# Patient Record
Sex: Female | Born: 1960 | Race: White | Hispanic: No | Marital: Married | Smoking: Never smoker
Health system: Southern US, Community
[De-identification: ages and names within clinical notes are randomized; demographics above are authoritative.]

## PROBLEM LIST (undated history)

## (undated) DIAGNOSIS — T7840XA Allergy, unspecified, initial encounter: Secondary | ICD-10-CM

## (undated) DIAGNOSIS — C801 Malignant (primary) neoplasm, unspecified: Secondary | ICD-10-CM

## (undated) DIAGNOSIS — I509 Heart failure, unspecified: Secondary | ICD-10-CM

## (undated) DIAGNOSIS — E785 Hyperlipidemia, unspecified: Secondary | ICD-10-CM

## (undated) DIAGNOSIS — D219 Benign neoplasm of connective and other soft tissue, unspecified: Secondary | ICD-10-CM

## (undated) DIAGNOSIS — I517 Cardiomegaly: Secondary | ICD-10-CM

## (undated) DIAGNOSIS — I10 Essential (primary) hypertension: Secondary | ICD-10-CM

## (undated) DIAGNOSIS — G4730 Sleep apnea, unspecified: Secondary | ICD-10-CM

## (undated) DIAGNOSIS — M8580 Other specified disorders of bone density and structure, unspecified site: Secondary | ICD-10-CM

## (undated) HISTORY — PX: TUBAL LIGATION: SHX77

## (undated) HISTORY — PX: POLYPECTOMY: SHX149

## (undated) HISTORY — DX: Benign neoplasm of connective and other soft tissue, unspecified: D21.9

## (undated) HISTORY — DX: Hyperlipidemia, unspecified: E78.5

## (undated) HISTORY — DX: Malignant (primary) neoplasm, unspecified: C80.1

## (undated) HISTORY — DX: Heart failure, unspecified: I50.9

## (undated) HISTORY — PX: WISDOM TOOTH EXTRACTION: SHX21

## (undated) HISTORY — PX: OTHER SURGICAL HISTORY: SHX169

## (undated) HISTORY — DX: Allergy, unspecified, initial encounter: T78.40XA

## (undated) HISTORY — DX: Other specified disorders of bone density and structure, unspecified site: M85.80

## (undated) HISTORY — PX: HEMORRHOIDECTOMY WITH HEMORRHOID BANDING: SHX5633

## (undated) HISTORY — DX: Essential (primary) hypertension: I10

## (undated) HISTORY — DX: Sleep apnea, unspecified: G47.30

## (undated) HISTORY — DX: Cardiomegaly: I51.7

---

## 1989-07-13 DIAGNOSIS — I517 Cardiomegaly: Secondary | ICD-10-CM

## 1989-07-13 HISTORY — DX: Cardiomegaly: I51.7

## 1990-07-13 DIAGNOSIS — I509 Heart failure, unspecified: Secondary | ICD-10-CM

## 1990-07-13 HISTORY — DX: Heart failure, unspecified: I50.9

## 1998-07-03 ENCOUNTER — Other Ambulatory Visit: Admission: RE | Admit: 1998-07-03 | Discharge: 1998-07-03 | Payer: Self-pay | Admitting: Obstetrics and Gynecology

## 1999-12-11 ENCOUNTER — Ambulatory Visit (HOSPITAL_COMMUNITY): Admission: RE | Admit: 1999-12-11 | Discharge: 1999-12-11 | Payer: Self-pay | Admitting: Family Medicine

## 1999-12-11 ENCOUNTER — Encounter: Payer: Self-pay | Admitting: Family Medicine

## 2000-03-25 ENCOUNTER — Other Ambulatory Visit: Admission: RE | Admit: 2000-03-25 | Discharge: 2000-03-25 | Payer: Self-pay | Admitting: Obstetrics and Gynecology

## 2001-06-28 ENCOUNTER — Other Ambulatory Visit: Admission: RE | Admit: 2001-06-28 | Discharge: 2001-06-28 | Payer: Self-pay | Admitting: Obstetrics and Gynecology

## 2002-07-24 ENCOUNTER — Other Ambulatory Visit: Admission: RE | Admit: 2002-07-24 | Discharge: 2002-07-24 | Payer: Self-pay | Admitting: Obstetrics and Gynecology

## 2003-08-10 ENCOUNTER — Other Ambulatory Visit: Admission: RE | Admit: 2003-08-10 | Discharge: 2003-08-10 | Payer: Self-pay | Admitting: Obstetrics and Gynecology

## 2004-08-25 ENCOUNTER — Other Ambulatory Visit: Admission: RE | Admit: 2004-08-25 | Discharge: 2004-08-25 | Payer: Self-pay | Admitting: Obstetrics and Gynecology

## 2004-11-12 ENCOUNTER — Ambulatory Visit (HOSPITAL_COMMUNITY): Admission: RE | Admit: 2004-11-12 | Discharge: 2004-11-12 | Payer: Self-pay | Admitting: Family Medicine

## 2006-05-13 HISTORY — PX: COLONOSCOPY: SHX174

## 2006-05-18 ENCOUNTER — Ambulatory Visit: Payer: Self-pay | Admitting: Gastroenterology

## 2006-06-07 ENCOUNTER — Ambulatory Visit: Payer: Self-pay | Admitting: Gastroenterology

## 2006-07-15 ENCOUNTER — Ambulatory Visit: Payer: Self-pay | Admitting: Gastroenterology

## 2006-09-22 ENCOUNTER — Ambulatory Visit (HOSPITAL_COMMUNITY): Admission: RE | Admit: 2006-09-22 | Discharge: 2006-09-22 | Payer: Self-pay | Admitting: Surgery

## 2011-12-10 ENCOUNTER — Other Ambulatory Visit: Payer: Self-pay | Admitting: Physician Assistant

## 2011-12-10 ENCOUNTER — Ambulatory Visit
Admission: RE | Admit: 2011-12-10 | Discharge: 2011-12-10 | Disposition: A | Discharge status: Home / Self Care | Payer: BC Managed Care – PPO | Source: Ambulatory Visit | Attending: Physician Assistant | Admitting: Physician Assistant

## 2011-12-10 DIAGNOSIS — R05 Cough: Secondary | ICD-10-CM

## 2011-12-10 DIAGNOSIS — R059 Cough, unspecified: Secondary | ICD-10-CM

## 2012-01-04 ENCOUNTER — Ambulatory Visit: Payer: Self-pay | Admitting: Cardiovascular Disease

## 2013-01-23 ENCOUNTER — Ambulatory Visit: Payer: Worker's Compensation | Attending: Orthopedic Surgery | Admitting: Physical Therapy

## 2013-01-23 DIAGNOSIS — M25519 Pain in unspecified shoulder: Secondary | ICD-10-CM | POA: Insufficient documentation

## 2013-01-23 DIAGNOSIS — M25619 Stiffness of unspecified shoulder, not elsewhere classified: Secondary | ICD-10-CM | POA: Insufficient documentation

## 2013-01-23 DIAGNOSIS — IMO0001 Reserved for inherently not codable concepts without codable children: Secondary | ICD-10-CM | POA: Insufficient documentation

## 2013-01-23 DIAGNOSIS — M7989 Other specified soft tissue disorders: Secondary | ICD-10-CM | POA: Insufficient documentation

## 2013-01-25 ENCOUNTER — Ambulatory Visit: Payer: Worker's Compensation | Attending: Orthopedic Surgery | Admitting: Physical Therapy

## 2013-01-25 DIAGNOSIS — IMO0001 Reserved for inherently not codable concepts without codable children: Secondary | ICD-10-CM | POA: Insufficient documentation

## 2013-01-25 DIAGNOSIS — M7989 Other specified soft tissue disorders: Secondary | ICD-10-CM | POA: Insufficient documentation

## 2013-01-25 DIAGNOSIS — M25519 Pain in unspecified shoulder: Secondary | ICD-10-CM | POA: Insufficient documentation

## 2013-01-25 DIAGNOSIS — M25619 Stiffness of unspecified shoulder, not elsewhere classified: Secondary | ICD-10-CM | POA: Insufficient documentation

## 2013-01-27 ENCOUNTER — Ambulatory Visit: Payer: Worker's Compensation | Attending: Orthopedic Surgery | Admitting: Physical Therapy

## 2013-01-27 DIAGNOSIS — M25519 Pain in unspecified shoulder: Secondary | ICD-10-CM | POA: Insufficient documentation

## 2013-01-27 DIAGNOSIS — M7989 Other specified soft tissue disorders: Secondary | ICD-10-CM | POA: Insufficient documentation

## 2013-01-27 DIAGNOSIS — IMO0001 Reserved for inherently not codable concepts without codable children: Secondary | ICD-10-CM | POA: Insufficient documentation

## 2013-01-27 DIAGNOSIS — M25619 Stiffness of unspecified shoulder, not elsewhere classified: Secondary | ICD-10-CM | POA: Insufficient documentation

## 2013-01-31 ENCOUNTER — Ambulatory Visit: Payer: Worker's Compensation | Admitting: Physical Therapy

## 2013-02-03 ENCOUNTER — Ambulatory Visit: Payer: Worker's Compensation | Attending: Orthopedic Surgery | Admitting: Physical Therapy

## 2013-02-03 DIAGNOSIS — M25519 Pain in unspecified shoulder: Secondary | ICD-10-CM | POA: Insufficient documentation

## 2013-02-03 DIAGNOSIS — M25619 Stiffness of unspecified shoulder, not elsewhere classified: Secondary | ICD-10-CM | POA: Insufficient documentation

## 2013-02-03 DIAGNOSIS — IMO0001 Reserved for inherently not codable concepts without codable children: Secondary | ICD-10-CM | POA: Insufficient documentation

## 2013-02-03 DIAGNOSIS — M7989 Other specified soft tissue disorders: Secondary | ICD-10-CM | POA: Insufficient documentation

## 2013-02-06 ENCOUNTER — Ambulatory Visit: Payer: Worker's Compensation | Attending: Orthopedic Surgery | Admitting: Physical Therapy

## 2013-02-06 DIAGNOSIS — M25619 Stiffness of unspecified shoulder, not elsewhere classified: Secondary | ICD-10-CM | POA: Insufficient documentation

## 2013-02-06 DIAGNOSIS — M25519 Pain in unspecified shoulder: Secondary | ICD-10-CM | POA: Insufficient documentation

## 2013-02-06 DIAGNOSIS — M7989 Other specified soft tissue disorders: Secondary | ICD-10-CM | POA: Insufficient documentation

## 2013-02-06 DIAGNOSIS — IMO0001 Reserved for inherently not codable concepts without codable children: Secondary | ICD-10-CM | POA: Insufficient documentation

## 2013-02-09 ENCOUNTER — Ambulatory Visit: Payer: Worker's Compensation | Attending: Orthopedic Surgery | Admitting: Physical Therapy

## 2013-02-09 DIAGNOSIS — IMO0001 Reserved for inherently not codable concepts without codable children: Secondary | ICD-10-CM | POA: Insufficient documentation

## 2013-02-09 DIAGNOSIS — M25519 Pain in unspecified shoulder: Secondary | ICD-10-CM | POA: Insufficient documentation

## 2013-02-09 DIAGNOSIS — M7989 Other specified soft tissue disorders: Secondary | ICD-10-CM | POA: Insufficient documentation

## 2013-02-09 DIAGNOSIS — M25619 Stiffness of unspecified shoulder, not elsewhere classified: Secondary | ICD-10-CM | POA: Insufficient documentation

## 2013-02-20 ENCOUNTER — Ambulatory Visit: Payer: Worker's Compensation | Attending: Orthopedic Surgery | Admitting: Physical Therapy

## 2013-02-20 DIAGNOSIS — M25519 Pain in unspecified shoulder: Secondary | ICD-10-CM | POA: Insufficient documentation

## 2013-02-20 DIAGNOSIS — IMO0001 Reserved for inherently not codable concepts without codable children: Secondary | ICD-10-CM | POA: Insufficient documentation

## 2013-02-20 DIAGNOSIS — M25619 Stiffness of unspecified shoulder, not elsewhere classified: Secondary | ICD-10-CM | POA: Insufficient documentation

## 2013-02-23 ENCOUNTER — Ambulatory Visit: Payer: Worker's Compensation | Attending: Orthopedic Surgery | Admitting: Physical Therapy

## 2013-02-23 DIAGNOSIS — M25619 Stiffness of unspecified shoulder, not elsewhere classified: Secondary | ICD-10-CM | POA: Insufficient documentation

## 2013-02-23 DIAGNOSIS — IMO0001 Reserved for inherently not codable concepts without codable children: Secondary | ICD-10-CM | POA: Insufficient documentation

## 2013-02-23 DIAGNOSIS — M25519 Pain in unspecified shoulder: Secondary | ICD-10-CM | POA: Insufficient documentation

## 2013-02-27 ENCOUNTER — Ambulatory Visit: Payer: Worker's Compensation | Attending: Orthopedic Surgery | Admitting: Physical Therapy

## 2013-02-27 DIAGNOSIS — M25519 Pain in unspecified shoulder: Secondary | ICD-10-CM | POA: Insufficient documentation

## 2013-02-27 DIAGNOSIS — M7989 Other specified soft tissue disorders: Secondary | ICD-10-CM | POA: Insufficient documentation

## 2013-02-27 DIAGNOSIS — IMO0001 Reserved for inherently not codable concepts without codable children: Secondary | ICD-10-CM | POA: Insufficient documentation

## 2013-02-27 DIAGNOSIS — M25619 Stiffness of unspecified shoulder, not elsewhere classified: Secondary | ICD-10-CM | POA: Insufficient documentation

## 2013-03-02 ENCOUNTER — Ambulatory Visit: Payer: Worker's Compensation | Attending: Orthopedic Surgery | Admitting: Physical Therapy

## 2013-03-02 DIAGNOSIS — M25519 Pain in unspecified shoulder: Secondary | ICD-10-CM | POA: Insufficient documentation

## 2013-03-02 DIAGNOSIS — M25619 Stiffness of unspecified shoulder, not elsewhere classified: Secondary | ICD-10-CM | POA: Insufficient documentation

## 2013-03-02 DIAGNOSIS — IMO0001 Reserved for inherently not codable concepts without codable children: Secondary | ICD-10-CM | POA: Insufficient documentation

## 2013-03-02 DIAGNOSIS — M7989 Other specified soft tissue disorders: Secondary | ICD-10-CM | POA: Insufficient documentation

## 2013-03-03 ENCOUNTER — Encounter: Payer: Self-pay | Admitting: Gastroenterology

## 2013-03-07 ENCOUNTER — Ambulatory Visit: Payer: Worker's Compensation | Attending: Orthopedic Surgery | Admitting: Physical Therapy

## 2013-03-07 DIAGNOSIS — IMO0001 Reserved for inherently not codable concepts without codable children: Secondary | ICD-10-CM | POA: Insufficient documentation

## 2013-03-07 DIAGNOSIS — M25519 Pain in unspecified shoulder: Secondary | ICD-10-CM | POA: Insufficient documentation

## 2013-03-07 DIAGNOSIS — M7989 Other specified soft tissue disorders: Secondary | ICD-10-CM | POA: Insufficient documentation

## 2013-03-07 DIAGNOSIS — M25619 Stiffness of unspecified shoulder, not elsewhere classified: Secondary | ICD-10-CM | POA: Insufficient documentation

## 2013-03-10 ENCOUNTER — Ambulatory Visit: Payer: Worker's Compensation | Attending: Orthopedic Surgery | Admitting: Physical Therapy

## 2013-03-10 DIAGNOSIS — M25619 Stiffness of unspecified shoulder, not elsewhere classified: Secondary | ICD-10-CM | POA: Insufficient documentation

## 2013-03-10 DIAGNOSIS — M25519 Pain in unspecified shoulder: Secondary | ICD-10-CM | POA: Insufficient documentation

## 2013-03-10 DIAGNOSIS — M7989 Other specified soft tissue disorders: Secondary | ICD-10-CM | POA: Insufficient documentation

## 2013-03-10 DIAGNOSIS — IMO0001 Reserved for inherently not codable concepts without codable children: Secondary | ICD-10-CM | POA: Insufficient documentation

## 2013-03-14 ENCOUNTER — Ambulatory Visit: Payer: Worker's Compensation | Attending: Orthopedic Surgery | Admitting: Physical Therapy

## 2013-03-14 DIAGNOSIS — M25519 Pain in unspecified shoulder: Secondary | ICD-10-CM | POA: Insufficient documentation

## 2013-03-14 DIAGNOSIS — IMO0001 Reserved for inherently not codable concepts without codable children: Secondary | ICD-10-CM | POA: Insufficient documentation

## 2013-03-14 DIAGNOSIS — M25619 Stiffness of unspecified shoulder, not elsewhere classified: Secondary | ICD-10-CM | POA: Insufficient documentation

## 2013-03-14 DIAGNOSIS — M7989 Other specified soft tissue disorders: Secondary | ICD-10-CM | POA: Insufficient documentation

## 2013-03-17 ENCOUNTER — Ambulatory Visit: Payer: Worker's Compensation | Attending: Orthopedic Surgery | Admitting: Physical Therapy

## 2013-03-17 DIAGNOSIS — M25619 Stiffness of unspecified shoulder, not elsewhere classified: Secondary | ICD-10-CM | POA: Insufficient documentation

## 2013-03-17 DIAGNOSIS — M25519 Pain in unspecified shoulder: Secondary | ICD-10-CM | POA: Insufficient documentation

## 2013-03-17 DIAGNOSIS — IMO0001 Reserved for inherently not codable concepts without codable children: Secondary | ICD-10-CM | POA: Insufficient documentation

## 2013-03-17 DIAGNOSIS — M7989 Other specified soft tissue disorders: Secondary | ICD-10-CM | POA: Insufficient documentation

## 2013-03-21 ENCOUNTER — Ambulatory Visit: Payer: Worker's Compensation | Attending: Orthopedic Surgery | Admitting: Physical Therapy

## 2013-03-21 DIAGNOSIS — M25619 Stiffness of unspecified shoulder, not elsewhere classified: Secondary | ICD-10-CM | POA: Insufficient documentation

## 2013-03-21 DIAGNOSIS — M7989 Other specified soft tissue disorders: Secondary | ICD-10-CM | POA: Insufficient documentation

## 2013-03-21 DIAGNOSIS — M25519 Pain in unspecified shoulder: Secondary | ICD-10-CM | POA: Insufficient documentation

## 2013-03-21 DIAGNOSIS — IMO0001 Reserved for inherently not codable concepts without codable children: Secondary | ICD-10-CM | POA: Insufficient documentation

## 2013-03-23 ENCOUNTER — Ambulatory Visit: Payer: Worker's Compensation | Attending: Orthopedic Surgery | Admitting: Physical Therapy

## 2013-03-23 DIAGNOSIS — M7989 Other specified soft tissue disorders: Secondary | ICD-10-CM | POA: Insufficient documentation

## 2013-03-23 DIAGNOSIS — M25619 Stiffness of unspecified shoulder, not elsewhere classified: Secondary | ICD-10-CM | POA: Insufficient documentation

## 2013-03-23 DIAGNOSIS — M25519 Pain in unspecified shoulder: Secondary | ICD-10-CM | POA: Insufficient documentation

## 2013-03-23 DIAGNOSIS — IMO0001 Reserved for inherently not codable concepts without codable children: Secondary | ICD-10-CM | POA: Insufficient documentation

## 2013-04-03 ENCOUNTER — Ambulatory Visit: Payer: Worker's Compensation | Attending: Orthopedic Surgery | Admitting: Physical Therapy

## 2013-04-03 DIAGNOSIS — IMO0001 Reserved for inherently not codable concepts without codable children: Secondary | ICD-10-CM | POA: Insufficient documentation

## 2013-04-03 DIAGNOSIS — M25619 Stiffness of unspecified shoulder, not elsewhere classified: Secondary | ICD-10-CM | POA: Insufficient documentation

## 2013-04-03 DIAGNOSIS — M25519 Pain in unspecified shoulder: Secondary | ICD-10-CM | POA: Insufficient documentation

## 2013-04-03 DIAGNOSIS — M7989 Other specified soft tissue disorders: Secondary | ICD-10-CM | POA: Insufficient documentation

## 2013-04-06 ENCOUNTER — Ambulatory Visit: Payer: Worker's Compensation | Attending: Orthopedic Surgery | Admitting: Physical Therapy

## 2013-04-06 DIAGNOSIS — M25519 Pain in unspecified shoulder: Secondary | ICD-10-CM | POA: Insufficient documentation

## 2013-04-06 DIAGNOSIS — M25619 Stiffness of unspecified shoulder, not elsewhere classified: Secondary | ICD-10-CM | POA: Insufficient documentation

## 2013-04-06 DIAGNOSIS — IMO0001 Reserved for inherently not codable concepts without codable children: Secondary | ICD-10-CM | POA: Insufficient documentation

## 2013-04-06 DIAGNOSIS — M7989 Other specified soft tissue disorders: Secondary | ICD-10-CM | POA: Insufficient documentation

## 2013-04-11 ENCOUNTER — Ambulatory Visit: Payer: Worker's Compensation | Attending: Orthopedic Surgery | Admitting: Physical Therapy

## 2013-04-11 DIAGNOSIS — IMO0001 Reserved for inherently not codable concepts without codable children: Secondary | ICD-10-CM | POA: Insufficient documentation

## 2013-04-11 DIAGNOSIS — M25619 Stiffness of unspecified shoulder, not elsewhere classified: Secondary | ICD-10-CM | POA: Insufficient documentation

## 2013-04-11 DIAGNOSIS — M7989 Other specified soft tissue disorders: Secondary | ICD-10-CM | POA: Insufficient documentation

## 2013-04-11 DIAGNOSIS — M25519 Pain in unspecified shoulder: Secondary | ICD-10-CM | POA: Insufficient documentation

## 2013-04-12 ENCOUNTER — Ambulatory Visit: Payer: Worker's Compensation | Admitting: Physical Therapy

## 2013-04-14 ENCOUNTER — Ambulatory Visit: Payer: Worker's Compensation | Attending: Orthopedic Surgery | Admitting: Physical Therapy

## 2013-04-14 DIAGNOSIS — M25519 Pain in unspecified shoulder: Secondary | ICD-10-CM | POA: Insufficient documentation

## 2013-04-14 DIAGNOSIS — M7989 Other specified soft tissue disorders: Secondary | ICD-10-CM | POA: Insufficient documentation

## 2013-04-14 DIAGNOSIS — IMO0001 Reserved for inherently not codable concepts without codable children: Secondary | ICD-10-CM | POA: Insufficient documentation

## 2013-04-14 DIAGNOSIS — M25619 Stiffness of unspecified shoulder, not elsewhere classified: Secondary | ICD-10-CM | POA: Insufficient documentation

## 2013-04-18 ENCOUNTER — Ambulatory Visit: Payer: Worker's Compensation | Attending: Orthopedic Surgery | Admitting: Physical Therapy

## 2013-04-18 DIAGNOSIS — M25619 Stiffness of unspecified shoulder, not elsewhere classified: Secondary | ICD-10-CM | POA: Insufficient documentation

## 2013-04-18 DIAGNOSIS — IMO0001 Reserved for inherently not codable concepts without codable children: Secondary | ICD-10-CM | POA: Insufficient documentation

## 2013-04-18 DIAGNOSIS — M25519 Pain in unspecified shoulder: Secondary | ICD-10-CM | POA: Insufficient documentation

## 2013-04-18 DIAGNOSIS — M7989 Other specified soft tissue disorders: Secondary | ICD-10-CM | POA: Insufficient documentation

## 2013-04-21 ENCOUNTER — Ambulatory Visit: Payer: Worker's Compensation | Attending: Orthopedic Surgery | Admitting: Physical Therapy

## 2013-04-21 DIAGNOSIS — M25519 Pain in unspecified shoulder: Secondary | ICD-10-CM | POA: Insufficient documentation

## 2013-04-21 DIAGNOSIS — IMO0001 Reserved for inherently not codable concepts without codable children: Secondary | ICD-10-CM | POA: Insufficient documentation

## 2013-04-21 DIAGNOSIS — M25619 Stiffness of unspecified shoulder, not elsewhere classified: Secondary | ICD-10-CM | POA: Insufficient documentation

## 2013-04-21 DIAGNOSIS — M7989 Other specified soft tissue disorders: Secondary | ICD-10-CM | POA: Insufficient documentation

## 2013-04-25 ENCOUNTER — Ambulatory Visit: Payer: Worker's Compensation | Attending: Orthopedic Surgery | Admitting: Physical Therapy

## 2013-04-25 DIAGNOSIS — M25619 Stiffness of unspecified shoulder, not elsewhere classified: Secondary | ICD-10-CM | POA: Insufficient documentation

## 2013-04-25 DIAGNOSIS — M25519 Pain in unspecified shoulder: Secondary | ICD-10-CM | POA: Insufficient documentation

## 2013-04-25 DIAGNOSIS — M7989 Other specified soft tissue disorders: Secondary | ICD-10-CM | POA: Insufficient documentation

## 2013-04-25 DIAGNOSIS — IMO0001 Reserved for inherently not codable concepts without codable children: Secondary | ICD-10-CM | POA: Insufficient documentation

## 2013-04-28 ENCOUNTER — Ambulatory Visit: Payer: Worker's Compensation | Attending: Orthopedic Surgery | Admitting: Physical Therapy

## 2013-04-28 DIAGNOSIS — M7989 Other specified soft tissue disorders: Secondary | ICD-10-CM | POA: Insufficient documentation

## 2013-04-28 DIAGNOSIS — M25619 Stiffness of unspecified shoulder, not elsewhere classified: Secondary | ICD-10-CM | POA: Insufficient documentation

## 2013-04-28 DIAGNOSIS — M25519 Pain in unspecified shoulder: Secondary | ICD-10-CM | POA: Insufficient documentation

## 2013-04-28 DIAGNOSIS — IMO0001 Reserved for inherently not codable concepts without codable children: Secondary | ICD-10-CM | POA: Insufficient documentation

## 2013-05-01 ENCOUNTER — Ambulatory Visit: Payer: Worker's Compensation | Attending: Orthopedic Surgery | Admitting: Physical Therapy

## 2013-05-01 DIAGNOSIS — M25619 Stiffness of unspecified shoulder, not elsewhere classified: Secondary | ICD-10-CM | POA: Insufficient documentation

## 2013-05-01 DIAGNOSIS — IMO0001 Reserved for inherently not codable concepts without codable children: Secondary | ICD-10-CM | POA: Insufficient documentation

## 2013-05-01 DIAGNOSIS — M25519 Pain in unspecified shoulder: Secondary | ICD-10-CM | POA: Insufficient documentation

## 2013-05-01 DIAGNOSIS — M7989 Other specified soft tissue disorders: Secondary | ICD-10-CM | POA: Insufficient documentation

## 2013-05-04 ENCOUNTER — Ambulatory Visit: Payer: Worker's Compensation | Attending: Orthopedic Surgery | Admitting: Physical Therapy

## 2013-05-04 DIAGNOSIS — M7989 Other specified soft tissue disorders: Secondary | ICD-10-CM | POA: Insufficient documentation

## 2013-05-04 DIAGNOSIS — M25619 Stiffness of unspecified shoulder, not elsewhere classified: Secondary | ICD-10-CM | POA: Insufficient documentation

## 2013-05-04 DIAGNOSIS — IMO0001 Reserved for inherently not codable concepts without codable children: Secondary | ICD-10-CM | POA: Insufficient documentation

## 2013-05-04 DIAGNOSIS — M25519 Pain in unspecified shoulder: Secondary | ICD-10-CM | POA: Insufficient documentation

## 2014-03-29 ENCOUNTER — Other Ambulatory Visit: Payer: Self-pay | Admitting: Obstetrics and Gynecology

## 2014-07-03 ENCOUNTER — Other Ambulatory Visit: Payer: Self-pay | Admitting: Dermatology

## 2015-03-22 ENCOUNTER — Other Ambulatory Visit: Payer: Self-pay | Admitting: Obstetrics and Gynecology

## 2015-03-25 LAB — CYTOLOGY - PAP

## 2015-11-19 DIAGNOSIS — E785 Hyperlipidemia, unspecified: Secondary | ICD-10-CM | POA: Diagnosis not present

## 2015-11-19 DIAGNOSIS — F439 Reaction to severe stress, unspecified: Secondary | ICD-10-CM | POA: Diagnosis not present

## 2015-11-19 DIAGNOSIS — I10 Essential (primary) hypertension: Secondary | ICD-10-CM | POA: Diagnosis not present

## 2015-11-19 DIAGNOSIS — R002 Palpitations: Secondary | ICD-10-CM | POA: Diagnosis not present

## 2015-12-10 DIAGNOSIS — R002 Palpitations: Secondary | ICD-10-CM | POA: Diagnosis not present

## 2015-12-10 DIAGNOSIS — R0602 Shortness of breath: Secondary | ICD-10-CM | POA: Diagnosis not present

## 2015-12-11 DIAGNOSIS — R002 Palpitations: Secondary | ICD-10-CM | POA: Diagnosis not present

## 2015-12-30 DIAGNOSIS — R0609 Other forms of dyspnea: Secondary | ICD-10-CM | POA: Diagnosis not present

## 2015-12-30 DIAGNOSIS — R002 Palpitations: Secondary | ICD-10-CM | POA: Diagnosis not present

## 2016-04-24 ENCOUNTER — Encounter: Payer: Self-pay | Admitting: Gastroenterology

## 2016-04-30 ENCOUNTER — Encounter: Payer: Self-pay | Admitting: Gastroenterology

## 2016-05-22 ENCOUNTER — Ambulatory Visit: Payer: BLUE CROSS/BLUE SHIELD | Admitting: *Deleted

## 2016-05-22 VITALS — Ht 63.25 in | Wt 182.4 lb

## 2016-05-22 DIAGNOSIS — Z1211 Encounter for screening for malignant neoplasm of colon: Secondary | ICD-10-CM

## 2016-05-22 MED ORDER — SUPREP BOWEL PREP KIT 17.5-3.13-1.6 GM/177ML PO SOLN: 1.0000 | Freq: Once | ORAL | 0 refills | Status: AC

## 2016-05-22 NOTE — Progress Notes (Signed)
Patient denies any allergies to egg or soy products. Patient denies complications with anesthesia/sedation.  Patient denies oxygen use at home and denies diet medications. Emmi instructions for colonoscopy  explained but denied.  Pamphlet given to patient.

## 2016-05-26 DIAGNOSIS — Z6832 Body mass index (BMI) 32.0-32.9, adult: Secondary | ICD-10-CM | POA: Diagnosis not present

## 2016-05-26 DIAGNOSIS — Z01419 Encounter for gynecological examination (general) (routine) without abnormal findings: Secondary | ICD-10-CM | POA: Diagnosis not present

## 2016-05-26 DIAGNOSIS — Z1231 Encounter for screening mammogram for malignant neoplasm of breast: Secondary | ICD-10-CM | POA: Diagnosis not present

## 2016-05-29 ENCOUNTER — Encounter: Payer: Self-pay | Admitting: Gastroenterology

## 2016-06-08 DIAGNOSIS — E785 Hyperlipidemia, unspecified: Secondary | ICD-10-CM | POA: Diagnosis not present

## 2016-06-08 DIAGNOSIS — Z23 Encounter for immunization: Secondary | ICD-10-CM | POA: Diagnosis not present

## 2016-06-08 DIAGNOSIS — I10 Essential (primary) hypertension: Secondary | ICD-10-CM | POA: Diagnosis not present

## 2016-06-08 DIAGNOSIS — I493 Ventricular premature depolarization: Secondary | ICD-10-CM | POA: Diagnosis not present

## 2016-06-12 ENCOUNTER — Ambulatory Visit (AMBULATORY_SURGERY_CENTER): Payer: BLUE CROSS/BLUE SHIELD | Admitting: Gastroenterology

## 2016-06-12 ENCOUNTER — Encounter: Payer: Self-pay | Admitting: Gastroenterology

## 2016-06-12 VITALS — BP 91/69 | HR 73 | Temp 98.2°F | Resp 19 | Ht 63.5 in | Wt 182.0 lb

## 2016-06-12 DIAGNOSIS — D122 Benign neoplasm of ascending colon: Secondary | ICD-10-CM | POA: Diagnosis not present

## 2016-06-12 DIAGNOSIS — Z1212 Encounter for screening for malignant neoplasm of rectum: Secondary | ICD-10-CM

## 2016-06-12 DIAGNOSIS — D120 Benign neoplasm of cecum: Secondary | ICD-10-CM

## 2016-06-12 DIAGNOSIS — Z1211 Encounter for screening for malignant neoplasm of colon: Secondary | ICD-10-CM

## 2016-06-12 DIAGNOSIS — D125 Benign neoplasm of sigmoid colon: Secondary | ICD-10-CM | POA: Diagnosis not present

## 2016-06-12 DIAGNOSIS — D123 Benign neoplasm of transverse colon: Secondary | ICD-10-CM | POA: Diagnosis not present

## 2016-06-12 MED ORDER — SODIUM CHLORIDE 0.9 % IV SOLN: 500.0000 mL | INTRAVENOUS | Status: DC

## 2016-06-12 NOTE — Op Note (Signed)
[facility] Patient Name: Joanne Brewer Procedure Date: 06/12/2016 1:56 PM MRN: [id number] Endoscopist: Remo Lipps P. Gussie Murton MD, MD Age: 55 Referring MD:  Date of Birth: 1961-06-13 Gender: Female Account #: [id number] Procedure:                Colonoscopy Indications:              Screening for malignant neoplasm in the colon Medicines:                Monitored Anesthesia Care Procedure:                Pre-Anesthesia Assessment:                           - Prior to the procedure, a History and Physical                            was performed, and patient medications and                            allergies were reviewed. The patient's tolerance of                            previous anesthesia was also reviewed. The risks                            and benefits of the procedure and the sedation                            options and risks were discussed with the patient.                            All questions were answered, and informed consent                            was obtained. Prior Anticoagulants: The patient has                            taken no previous anticoagulant or antiplatelet                            agents. ASA Grade Assessment: II - A patient with                            mild systemic disease. After reviewing the risks                            and benefits, the patient was deemed in                            satisfactory condition to undergo the procedure.                           After obtaining informed consent, the colonoscope  was passed under direct vision. Throughout the                            procedure, the patient's blood pressure, pulse, and                            oxygen saturations were monitored continuously. The                            Model CF-HQ190L [id number]) scope was introduced                            through the anus and advanced to the the cecum,                             identified by appendiceal orifice and ileocecal                            valve. The colonoscopy was performed without                            difficulty. The patient tolerated the procedure                            well. The quality of the bowel preparation was                            good. The ileocecal valve, appendiceal orifice, and                            rectum were photographed. Scope In: 2:14:35 PM Scope Out: 2:33:44 PM Scope Withdrawal Time: 0 hours 12 minutes 5 seconds  Total Procedure Duration: 0 hours 19 minutes 9 seconds  Findings:                 The perianal and digital rectal examinations were                            normal.                           A 5 mm polyp was found in the cecum. The polyp was                            sessile. The polyp was removed with a cold snare.                            Resection and retrieval were complete.                           A 5 mm polyp was found in the ascending colon. The                            polyp was sessile. The polyp was removed with  a                            cold snare. Resection and retrieval were complete.                           A 4 mm polyp was found in the transverse colon. The                            polyp was sessile. The polyp was removed with a                            cold snare. Resection and retrieval were complete.                           A 5 mm polyp was found in the sigmoid colon. The                            polyp was sessile. The polyp was removed with a                            cold snare. Resection and retrieval were complete.                           A few medium-mouthed diverticula were found in the                            sigmoid colon.                           Internal hemorrhoids were found during retroflexion.                           The exam was otherwise without abnormality. Complications:            No immediate complications. Estimated blood loss:                             Minimal. Estimated Blood Loss:     Estimated blood loss was minimal. Impression:               - One 5 mm polyp in the cecum, removed with a cold                            snare. Resected and retrieved.                           - One 5 mm polyp in the ascending colon, removed                            with a cold snare. Resected and retrieved.                           - One 4 mm polyp in the transverse colon, removed  with a cold snare. Resected and retrieved.                           - One 5 mm polyp in the sigmoid colon, removed with                            a cold snare. Resected and retrieved.                           - Diverticulosis in the sigmoid colon.                           - Internal hemorrhoids.                           - The examination was otherwise normal. Recommendation:           - Patient has a contact number available for                            emergencies. The signs and symptoms of potential                            delayed complications were discussed with the                            patient. Return to normal activities tomorrow.                            Written discharge instructions were provided to the                            patient.                           - Resume previous diet.                           - Continue present medications.                           - No ibuprofen, naproxen, or other non-steroidal                            anti-inflammatory drugs for 2 weeks after polyp                            removal.                           - Await pathology results.                           - Repeat colonoscopy is recommended for                            surveillance. The colonoscopy date will  be                            determined after pathology results from today's                            exam become available for review. Remo Lipps P. Nakira Litzau MD, MD 06/12/2016 2:39:12  PM This report has been signed electronically.

## 2016-06-12 NOTE — Progress Notes (Signed)
A and O x3. Report to RN. Tolerated MAC anesthesia well. 

## 2016-06-12 NOTE — Patient Instructions (Signed)
Impression/Recommendations:  Polyp handout given to patient. Hemorrhoid handout given to patient. Diverticulosis handout given to patient.  No ibuprofen, naproxen, or other NSAID drugs for 2 weeks.  Tylenol only until 12/15/ 15.  Repeat colonoscopy for surveillance based on pathology results.  YOU HAD AN ENDOSCOPIC PROCEDURE TODAY AT [facility]:   Refer to the procedure report that was given to you for any specific questions about what was found during the examination.  If the procedure report does not answer your questions, please call your gastroenterologist to clarify.  If you requested that your care partner not be given the details of your procedure findings, then the procedure report has been included in a sealed envelope for you to review at your convenience later.  YOU SHOULD EXPECT: Some feelings of bloating in the abdomen. Passage of more gas than usual.  Walking can help get rid of the air that was put into your GI tract during the procedure and reduce the bloating. If you had a lower endoscopy (such as a colonoscopy or flexible sigmoidoscopy) you may notice spotting of blood in your stool or on the toilet paper. If you underwent a bowel prep for your procedure, you may not have a normal bowel movement for a few days.  Please Note:  You might notice some irritation and congestion in your nose or some drainage.  This is from the oxygen used during your procedure.  There is no need for concern and it should clear up in a day or so.  SYMPTOMS TO REPORT IMMEDIATELY:   Following lower endoscopy (colonoscopy or flexible sigmoidoscopy):  Excessive amounts of blood in the stool  Significant tenderness or worsening of abdominal pains  Swelling of the abdomen that is new, acute  Fever of 100F or higher    For urgent or emergent issues, a gastroenterologist can be reached at any hour by calling [phone].   DIET:  We do recommend a small meal at first, but then  you may proceed to your regular diet.  Drink plenty of fluids but you should avoid alcoholic beverages for 24 hours.  ACTIVITY:  You should plan to take it easy for the rest of today and you should NOT DRIVE or use heavy machinery until tomorrow (because of the sedation medicines used during the test).    FOLLOW UP: Our staff will call the number listed on your records the next business day following your procedure to check on you and address any questions or concerns that you may have regarding the information given to you following your procedure. If we do not reach you, we will leave a message.  However, if you are feeling well and you are not experiencing any problems, there is no need to return our call.  We will assume that you have returned to your regular daily activities without incident.  If any biopsies were taken you will be contacted by phone or by letter within the next 1-3 weeks.  Please call us at [phone] if you have not heard about the biopsies in 3 weeks.    SIGNATURES/CONFIDENTIALITY: You and/or your care partner have signed paperwork which will be entered into your electronic medical record.  These signatures attest to the fact that that the information above on your After Visit Summary has been reviewed and is understood.  Full responsibility of the confidentiality of this discharge information lies with you and/or your care-partner.

## 2016-06-12 NOTE — Progress Notes (Signed)
Called to room to assist during endoscopic procedure.  Patient ID and intended procedure confirmed with present staff. Received instructions for my participation in the procedure from the performing physician.  

## 2016-06-15 ENCOUNTER — Telehealth: Payer: Self-pay | Admitting: *Deleted

## 2016-06-15 NOTE — Telephone Encounter (Signed)
  Follow up Call-  Call back number 06/12/2016  Post procedure Call Back phone  # [phone]  Permission to leave phone message Yes  Some recent data might be hidden     Patient questions:  Do you have a fever, pain , or abdominal swelling? No. Pain Score  0 *  Have you tolerated food without any problems? Yes.    Have you been able to return to your normal activities? Yes.    Do you have any questions about your discharge instructions: Diet   No. Medications  No. Follow up visit  No.  Do you have questions or concerns about your Care? No.  Actions: * If pain score is 4 or above: No action needed, pain <4.

## 2016-06-17 DIAGNOSIS — D225 Melanocytic nevi of trunk: Secondary | ICD-10-CM | POA: Diagnosis not present

## 2016-06-17 DIAGNOSIS — Z85828 Personal history of other malignant neoplasm of skin: Secondary | ICD-10-CM | POA: Diagnosis not present

## 2016-06-17 DIAGNOSIS — Z86018 Personal history of other benign neoplasm: Secondary | ICD-10-CM | POA: Diagnosis not present

## 2016-06-17 DIAGNOSIS — D2271 Melanocytic nevi of right lower limb, including hip: Secondary | ICD-10-CM | POA: Diagnosis not present

## 2016-06-18 ENCOUNTER — Encounter: Payer: Self-pay | Admitting: Gastroenterology

## 2016-12-04 DIAGNOSIS — Z1211 Encounter for screening for malignant neoplasm of colon: Secondary | ICD-10-CM | POA: Diagnosis not present

## 2016-12-04 DIAGNOSIS — I10 Essential (primary) hypertension: Secondary | ICD-10-CM | POA: Diagnosis not present

## 2016-12-04 DIAGNOSIS — I493 Ventricular premature depolarization: Secondary | ICD-10-CM | POA: Diagnosis not present

## 2016-12-04 DIAGNOSIS — E7800 Pure hypercholesterolemia, unspecified: Secondary | ICD-10-CM | POA: Diagnosis not present

## 2016-12-04 DIAGNOSIS — Z0000 Encounter for general adult medical examination without abnormal findings: Secondary | ICD-10-CM | POA: Diagnosis not present

## 2017-03-10 DIAGNOSIS — N390 Urinary tract infection, site not specified: Secondary | ICD-10-CM | POA: Diagnosis not present

## 2017-03-10 DIAGNOSIS — N812 Incomplete uterovaginal prolapse: Secondary | ICD-10-CM | POA: Diagnosis not present

## 2017-03-10 DIAGNOSIS — N8110 Cystocele, unspecified: Secondary | ICD-10-CM | POA: Diagnosis not present

## 2017-03-12 DIAGNOSIS — N8110 Cystocele, unspecified: Secondary | ICD-10-CM | POA: Diagnosis not present

## 2017-03-12 DIAGNOSIS — N819 Female genital prolapse, unspecified: Secondary | ICD-10-CM | POA: Diagnosis not present

## 2017-03-17 DIAGNOSIS — N819 Female genital prolapse, unspecified: Secondary | ICD-10-CM | POA: Diagnosis not present

## 2017-04-20 DIAGNOSIS — N393 Stress incontinence (female) (male): Secondary | ICD-10-CM | POA: Diagnosis not present

## 2017-04-20 DIAGNOSIS — N390 Urinary tract infection, site not specified: Secondary | ICD-10-CM | POA: Diagnosis not present

## 2017-05-04 DIAGNOSIS — N393 Stress incontinence (female) (male): Secondary | ICD-10-CM | POA: Diagnosis not present

## 2017-05-18 DIAGNOSIS — N393 Stress incontinence (female) (male): Secondary | ICD-10-CM | POA: Diagnosis not present

## 2017-05-18 DIAGNOSIS — N8111 Cystocele, midline: Secondary | ICD-10-CM | POA: Diagnosis not present

## 2017-05-18 DIAGNOSIS — N819 Female genital prolapse, unspecified: Secondary | ICD-10-CM | POA: Diagnosis not present

## 2017-05-18 DIAGNOSIS — K5901 Slow transit constipation: Secondary | ICD-10-CM | POA: Diagnosis not present

## 2017-05-25 DIAGNOSIS — N393 Stress incontinence (female) (male): Secondary | ICD-10-CM | POA: Diagnosis not present

## 2017-05-25 DIAGNOSIS — N8111 Cystocele, midline: Secondary | ICD-10-CM | POA: Diagnosis not present

## 2017-05-31 DIAGNOSIS — Z1231 Encounter for screening mammogram for malignant neoplasm of breast: Secondary | ICD-10-CM | POA: Diagnosis not present

## 2017-06-08 DIAGNOSIS — N393 Stress incontinence (female) (male): Secondary | ICD-10-CM | POA: Diagnosis not present

## 2017-06-08 DIAGNOSIS — D259 Leiomyoma of uterus, unspecified: Secondary | ICD-10-CM | POA: Diagnosis not present

## 2017-06-08 DIAGNOSIS — N800 Endometriosis of uterus: Secondary | ICD-10-CM | POA: Diagnosis not present

## 2017-06-08 DIAGNOSIS — N812 Incomplete uterovaginal prolapse: Secondary | ICD-10-CM | POA: Diagnosis not present

## 2017-06-08 DIAGNOSIS — N819 Female genital prolapse, unspecified: Secondary | ICD-10-CM | POA: Diagnosis not present

## 2017-06-08 DIAGNOSIS — N736 Female pelvic peritoneal adhesions (postinfective): Secondary | ICD-10-CM | POA: Diagnosis not present

## 2017-06-08 DIAGNOSIS — N888 Other specified noninflammatory disorders of cervix uteri: Secondary | ICD-10-CM | POA: Diagnosis not present

## 2017-06-08 DIAGNOSIS — M6208 Separation of muscle (nontraumatic), other site: Secondary | ICD-10-CM | POA: Diagnosis not present

## 2017-06-08 DIAGNOSIS — D261 Other benign neoplasm of corpus uteri: Secondary | ICD-10-CM | POA: Diagnosis not present

## 2017-06-08 DIAGNOSIS — N816 Rectocele: Secondary | ICD-10-CM | POA: Diagnosis not present

## 2017-06-09 DIAGNOSIS — N816 Rectocele: Secondary | ICD-10-CM | POA: Diagnosis not present

## 2017-06-09 DIAGNOSIS — D261 Other benign neoplasm of corpus uteri: Secondary | ICD-10-CM | POA: Diagnosis not present

## 2017-06-09 DIAGNOSIS — N819 Female genital prolapse, unspecified: Secondary | ICD-10-CM | POA: Diagnosis not present

## 2017-06-09 DIAGNOSIS — N800 Endometriosis of uterus: Secondary | ICD-10-CM | POA: Diagnosis not present

## 2017-06-09 DIAGNOSIS — N393 Stress incontinence (female) (male): Secondary | ICD-10-CM | POA: Diagnosis not present

## 2017-06-09 DIAGNOSIS — N736 Female pelvic peritoneal adhesions (postinfective): Secondary | ICD-10-CM | POA: Diagnosis not present

## 2017-06-15 DIAGNOSIS — N819 Female genital prolapse, unspecified: Secondary | ICD-10-CM | POA: Diagnosis not present

## 2017-06-25 DIAGNOSIS — K5900 Constipation, unspecified: Secondary | ICD-10-CM | POA: Diagnosis not present

## 2017-06-25 DIAGNOSIS — R3915 Urgency of urination: Secondary | ICD-10-CM | POA: Diagnosis not present

## 2017-06-25 DIAGNOSIS — E7800 Pure hypercholesterolemia, unspecified: Secondary | ICD-10-CM | POA: Diagnosis not present

## 2017-06-25 DIAGNOSIS — I10 Essential (primary) hypertension: Secondary | ICD-10-CM | POA: Diagnosis not present

## 2017-06-30 DIAGNOSIS — Z85828 Personal history of other malignant neoplasm of skin: Secondary | ICD-10-CM | POA: Diagnosis not present

## 2017-06-30 DIAGNOSIS — C4441 Basal cell carcinoma of skin of scalp and neck: Secondary | ICD-10-CM | POA: Diagnosis not present

## 2017-06-30 DIAGNOSIS — D225 Melanocytic nevi of trunk: Secondary | ICD-10-CM | POA: Diagnosis not present

## 2017-06-30 DIAGNOSIS — L821 Other seborrheic keratosis: Secondary | ICD-10-CM | POA: Diagnosis not present

## 2017-06-30 DIAGNOSIS — D485 Neoplasm of uncertain behavior of skin: Secondary | ICD-10-CM | POA: Diagnosis not present

## 2017-06-30 DIAGNOSIS — Z86018 Personal history of other benign neoplasm: Secondary | ICD-10-CM | POA: Diagnosis not present

## 2017-06-30 DIAGNOSIS — D2271 Melanocytic nevi of right lower limb, including hip: Secondary | ICD-10-CM | POA: Diagnosis not present

## 2017-07-09 DIAGNOSIS — N3001 Acute cystitis with hematuria: Secondary | ICD-10-CM | POA: Diagnosis not present

## 2017-07-21 DIAGNOSIS — R8290 Unspecified abnormal findings in urine: Secondary | ICD-10-CM | POA: Diagnosis not present

## 2017-08-16 DIAGNOSIS — C4441 Basal cell carcinoma of skin of scalp and neck: Secondary | ICD-10-CM | POA: Diagnosis not present

## 2017-08-16 DIAGNOSIS — L905 Scar conditions and fibrosis of skin: Secondary | ICD-10-CM | POA: Diagnosis not present

## 2018-01-20 DIAGNOSIS — E7800 Pure hypercholesterolemia, unspecified: Secondary | ICD-10-CM | POA: Diagnosis not present

## 2018-01-20 DIAGNOSIS — I10 Essential (primary) hypertension: Secondary | ICD-10-CM | POA: Diagnosis not present

## 2018-01-20 DIAGNOSIS — K5900 Constipation, unspecified: Secondary | ICD-10-CM | POA: Diagnosis not present

## 2018-01-20 DIAGNOSIS — Z0000 Encounter for general adult medical examination without abnormal findings: Secondary | ICD-10-CM | POA: Diagnosis not present

## 2018-01-20 DIAGNOSIS — H6122 Impacted cerumen, left ear: Secondary | ICD-10-CM | POA: Diagnosis not present

## 2018-01-20 DIAGNOSIS — Z23 Encounter for immunization: Secondary | ICD-10-CM | POA: Diagnosis not present

## 2018-04-14 DIAGNOSIS — Z23 Encounter for immunization: Secondary | ICD-10-CM | POA: Diagnosis not present

## 2018-06-02 DIAGNOSIS — Z1231 Encounter for screening mammogram for malignant neoplasm of breast: Secondary | ICD-10-CM | POA: Diagnosis not present

## 2018-06-02 DIAGNOSIS — Z01419 Encounter for gynecological examination (general) (routine) without abnormal findings: Secondary | ICD-10-CM | POA: Diagnosis not present

## 2018-06-02 DIAGNOSIS — Z6831 Body mass index (BMI) 31.0-31.9, adult: Secondary | ICD-10-CM | POA: Diagnosis not present

## 2018-06-30 DIAGNOSIS — Z13820 Encounter for screening for osteoporosis: Secondary | ICD-10-CM | POA: Diagnosis not present

## 2018-08-02 DIAGNOSIS — D225 Melanocytic nevi of trunk: Secondary | ICD-10-CM | POA: Diagnosis not present

## 2018-08-02 DIAGNOSIS — Z85828 Personal history of other malignant neoplasm of skin: Secondary | ICD-10-CM | POA: Diagnosis not present

## 2018-08-02 DIAGNOSIS — Z86018 Personal history of other benign neoplasm: Secondary | ICD-10-CM | POA: Diagnosis not present

## 2018-08-02 DIAGNOSIS — D2271 Melanocytic nevi of right lower limb, including hip: Secondary | ICD-10-CM | POA: Diagnosis not present

## 2018-09-23 DIAGNOSIS — K5900 Constipation, unspecified: Secondary | ICD-10-CM | POA: Diagnosis not present

## 2018-09-23 DIAGNOSIS — I10 Essential (primary) hypertension: Secondary | ICD-10-CM | POA: Diagnosis not present

## 2018-09-23 DIAGNOSIS — E7800 Pure hypercholesterolemia, unspecified: Secondary | ICD-10-CM | POA: Diagnosis not present

## 2018-09-29 DIAGNOSIS — R21 Rash and other nonspecific skin eruption: Secondary | ICD-10-CM | POA: Diagnosis not present

## 2018-10-17 DIAGNOSIS — B349 Viral infection, unspecified: Secondary | ICD-10-CM | POA: Diagnosis not present

## 2019-02-06 DIAGNOSIS — Z0000 Encounter for general adult medical examination without abnormal findings: Secondary | ICD-10-CM | POA: Diagnosis not present

## 2019-03-08 DIAGNOSIS — E7800 Pure hypercholesterolemia, unspecified: Secondary | ICD-10-CM | POA: Diagnosis not present

## 2019-03-08 DIAGNOSIS — M8588 Other specified disorders of bone density and structure, other site: Secondary | ICD-10-CM | POA: Diagnosis not present

## 2019-04-27 DIAGNOSIS — R399 Unspecified symptoms and signs involving the genitourinary system: Secondary | ICD-10-CM | POA: Diagnosis not present

## 2019-06-12 ENCOUNTER — Encounter: Payer: Self-pay | Admitting: Gastroenterology

## 2019-06-16 ENCOUNTER — Encounter: Payer: Self-pay | Admitting: Gastroenterology

## 2019-07-04 ENCOUNTER — Encounter: Payer: Self-pay | Admitting: Gastroenterology

## 2019-07-04 ENCOUNTER — Ambulatory Visit (AMBULATORY_SURGERY_CENTER): Payer: BC Managed Care – PPO

## 2019-07-04 ENCOUNTER — Other Ambulatory Visit: Payer: Self-pay

## 2019-07-04 VITALS — Temp 96.0°F | Ht 63.5 in | Wt 193.2 lb

## 2019-07-04 DIAGNOSIS — Z86010 Personal history of colonic polyps: Secondary | ICD-10-CM

## 2019-07-04 DIAGNOSIS — Z1159 Encounter for screening for other viral diseases: Secondary | ICD-10-CM

## 2019-07-04 MED ORDER — NA SULFATE-K SULFATE-MG SULF 17.5-3.13-1.6 GM/177ML PO SOLN: 1.0000 | Freq: Once | ORAL | 0 refills | Status: AC

## 2019-07-04 NOTE — Progress Notes (Signed)

## 2019-07-12 ENCOUNTER — Other Ambulatory Visit: Payer: Self-pay | Admitting: Gastroenterology

## 2019-07-12 ENCOUNTER — Ambulatory Visit (INDEPENDENT_AMBULATORY_CARE_PROVIDER_SITE_OTHER): Payer: BC Managed Care – PPO

## 2019-07-12 DIAGNOSIS — Z1159 Encounter for screening for other viral diseases: Secondary | ICD-10-CM | POA: Diagnosis not present

## 2019-07-13 LAB — SARS CORONAVIRUS 2 (TAT 6-24 HRS): SARS Coronavirus 2: NEGATIVE

## 2019-07-18 ENCOUNTER — Ambulatory Visit (AMBULATORY_SURGERY_CENTER): Payer: BC Managed Care – PPO | Admitting: Gastroenterology

## 2019-07-18 ENCOUNTER — Other Ambulatory Visit: Payer: Self-pay

## 2019-07-18 ENCOUNTER — Encounter: Payer: Self-pay | Admitting: Gastroenterology

## 2019-07-18 VITALS — BP 97/68 | HR 60 | Temp 97.9°F | Resp 22 | Ht 63.0 in | Wt 193.0 lb

## 2019-07-18 DIAGNOSIS — D123 Benign neoplasm of transverse colon: Secondary | ICD-10-CM

## 2019-07-18 DIAGNOSIS — Z86010 Personal history of colonic polyps: Secondary | ICD-10-CM | POA: Diagnosis not present

## 2019-07-18 DIAGNOSIS — Z1211 Encounter for screening for malignant neoplasm of colon: Secondary | ICD-10-CM | POA: Diagnosis not present

## 2019-07-18 DIAGNOSIS — D120 Benign neoplasm of cecum: Secondary | ICD-10-CM

## 2019-07-18 MED ORDER — SODIUM CHLORIDE 0.9 % IV SOLN: 500.0000 mL | INTRAVENOUS | Status: DC

## 2019-07-18 NOTE — Op Note (Signed)
[facility] Patient Name: Joanne Brewer Procedure Date: 07/18/2019 10:12 AM MRN: [id number] Endoscopist: Remo Lipps P. Havery Moros , MD Age: 59 Referring MD:  Date of Birth: 23-Jul-1960 Gender: Female Account #: [id number] Procedure:                Colonoscopy Indications:              High risk colon cancer surveillance: Personal                            history of colonic polyps (4 polyps removed 06/2016) Medicines:                Monitored Anesthesia Care Procedure:                Pre-Anesthesia Assessment:                           - Prior to the procedure, a History and Physical                            was performed, and patient medications and                            allergies were reviewed. The patient's tolerance of                            previous anesthesia was also reviewed. The risks                            and benefits of the procedure and the sedation                            options and risks were discussed with the patient.                            All questions were answered, and informed consent                            was obtained. Prior Anticoagulants: The patient has                            taken no previous anticoagulant or antiplatelet                            agents. ASA Grade Assessment: II - A patient with                            mild systemic disease. After reviewing the risks                            and benefits, the patient was deemed in                            satisfactory condition to undergo the procedure.  After obtaining informed consent, the colonoscope                            was passed under direct vision. Throughout the                            procedure, the patient's blood pressure, pulse, and                            oxygen saturations were monitored continuously.The                            colonoscopy was performed without difficulty. The   patient tolerated the procedure well. The quality                            of the bowel preparation was adequate. The                            ileocecal valve, appendiceal orifice, and rectum                            were photographed. The Colonoscope was introduced                            through the anus and advanced to the the cecum,                            identified by appendiceal orifice and ileocecal                            valve. Scope In: 10:19:03 AM Scope Out: 10:41:41 AM Scope Withdrawal Time: 0 hours 18 minutes 28 seconds  Total Procedure Duration: 0 hours 22 minutes 38 seconds  Findings:                 Hemorrhoids / skin tags were found on perianal exam.                           Two flat polyps were found in the cecum. The polyps                            were 2 to 6 mm in size. These polyps were removed                            with a cold snare. Resection and retrieval were                            complete.                           A diminutive polyp was found in the hepatic                            flexure. The polyp  was flat. The polyp was removed                            with a cold snare. Resection and retrieval were                            complete.                           A few small-mouthed diverticula were found in the                            sigmoid colon.                           Internal hemorrhoids were found during retroflexion.                           The exam was otherwise without abnormality. Complications:            No immediate complications. Estimated blood loss:                            Minimal. Estimated Blood Loss:     Estimated blood loss was minimal. Impression:               - Hemorrhoids / skin tags found on perianal exam.                           - Two 2 to 6 mm polyps in the cecum, removed with a                            cold snare. Resected and retrieved.                           - One diminutive  polyp at the hepatic flexure,                            removed with a cold snare. Resected and retrieved.                           - Diverticulosis in the sigmoid colon.                           - Internal hemorrhoids.                           - The examination was otherwise normal. Recommendation:           - Patient has a contact number available for                            emergencies. The signs and symptoms of potential                            delayed complications were discussed with the  patient. Return to normal activities tomorrow.                            Written discharge instructions were provided to the                            patient.                           - Resume previous diet.                           - Continue present medications.                           - Await pathology results. Remo Lipps P. Joanne Kellogg, MD 07/18/2019 10:47:47 AM This report has been signed electronically.

## 2019-07-18 NOTE — Progress Notes (Signed)
Called to room to assist during endoscopic procedure.  Patient ID and intended procedure confirmed with present staff. Received instructions for my participation in the procedure from the performing physician.  

## 2019-07-18 NOTE — Patient Instructions (Addendum)
Please read handouts provided. Continue present medications. Await pathology results.     YOU HAD AN ENDOSCOPIC PROCEDURE TODAY AT [facility]:   Refer to the procedure report that was given to you for any specific questions about what was found during the examination.  If the procedure report does not answer your questions, please call your gastroenterologist to clarify.  If you requested that your care partner not be given the details of your procedure findings, then the procedure report has been included in a sealed envelope for you to review at your convenience later.  YOU SHOULD EXPECT: Some feelings of bloating in the abdomen. Passage of more gas than usual.  Walking can help get rid of the air that was put into your GI tract during the procedure and reduce the bloating. If you had a lower endoscopy (such as a colonoscopy or flexible sigmoidoscopy) you may notice spotting of blood in your stool or on the toilet paper. If you underwent a bowel prep for your procedure, you may not have a normal bowel movement for a few days.  Please Note:  You might notice some irritation and congestion in your nose or some drainage.  This is from the oxygen used during your procedure.  There is no need for concern and it should clear up in a day or so.  SYMPTOMS TO REPORT IMMEDIATELY:   Following lower endoscopy (colonoscopy or flexible sigmoidoscopy):  Excessive amounts of blood in the stool  Significant tenderness or worsening of abdominal pains  Swelling of the abdomen that is new, acute  Fever of 100F or higher    For urgent or emergent issues, a gastroenterologist can be reached at any hour by calling [phone].   DIET:  We do recommend a small meal at first, but then you may proceed to your regular diet.  Drink plenty of fluids but you should avoid alcoholic beverages for 24 hours.  ACTIVITY:  You should plan to take it easy for the rest of today and you should NOT DRIVE  or use heavy machinery until tomorrow (because of the sedation medicines used during the test).    FOLLOW UP: Our staff will call the number listed on your records 48-72 hours following your procedure to check on you and address any questions or concerns that you may have regarding the information given to you following your procedure. If we do not reach you, we will leave a message.  We will attempt to reach you two times.  During this call, we will ask if you have developed any symptoms of COVID 19. If you develop any symptoms (ie: fever, flu-like symptoms, shortness of breath, cough etc.) before then, please call [phone].  If you test positive for Covid 19 in the 2 weeks post procedure, please call and report this information to [place].    If any biopsies were taken you will be contacted by phone or by letter within the next 1-3 weeks.  Please call us at [phone] if you have not heard about the biopsies in 3 weeks.    SIGNATURES/CONFIDENTIALITY: You and/or your care partner have signed paperwork which will be entered into your electronic medical record.  These signatures attest to the fact that that the information above on your After Visit Summary has been reviewed and is understood.  Full responsibility of the confidentiality of this discharge information lies with you and/or your care-partner.YOU HAD AN ENDOSCOPIC PROCEDURE TODAY AT [facility]:  Refer to the procedure report that was given to you for any specific questions about what was found during the examination.  If the procedure report does not answer your questions, please call your gastroenterologist to clarify.  If you requested that your care partner not be given the details of your procedure findings, then the procedure report has been included in a sealed envelope for you to review at your convenience later.  YOU SHOULD EXPECT: Some feelings of bloating in the abdomen. Passage of more gas than usual.  Walking  can help get rid of the air that was put into your GI tract during the procedure and reduce the bloating. If you had a lower endoscopy (such as a colonoscopy or flexible sigmoidoscopy) you may notice spotting of blood in your stool or on the toilet paper. If you underwent a bowel prep for your procedure, you may not have a normal bowel movement for a few days.  Please Note:  You might notice some irritation and congestion in your nose or some drainage.  This is from the oxygen used during your procedure.  There is no need for concern and it should clear up in a day or so.  SYMPTOMS TO REPORT IMMEDIATELY:   Following lower endoscopy (colonoscopy or flexible sigmoidoscopy):  Excessive amounts of blood in the stool  Significant tenderness or worsening of abdominal pains  Swelling of the abdomen that is new, acute  Fever of 100F or higher   Following upper endoscopy (EGD)  Vomiting of blood or coffee ground material  New chest pain or pain under the shoulder blades  Painful or persistently difficult swallowing  New shortness of breath  Fever of 100F or higher  Black, tarry-looking stools  For urgent or emergent issues, a gastroenterologist can be reached at any hour by calling [phone].   DIET:  We do recommend a small meal at first, but then you may proceed to your regular diet.  Drink plenty of fluids but you should avoid alcoholic beverages for 24 hours.  ACTIVITY:  You should plan to take it easy for the rest of today and you should NOT DRIVE or use heavy machinery until tomorrow (because of the sedation medicines used during the test).    FOLLOW UP: Our staff will call the number listed on your records 48-72 hours following your procedure to check on you and address any questions or concerns that you may have regarding the information given to you following your procedure. If we do not reach you, we will leave a message.  We will attempt to reach you two times.  During this  call, we will ask if you have developed any symptoms of COVID 19. If you develop any symptoms (ie: fever, flu-like symptoms, shortness of breath, cough etc.) before then, please call [phone].  If you test positive for Covid 19 in the 2 weeks post procedure, please call and report this information to [place].    If any biopsies were taken you will be contacted by phone or by letter within the next 1-3 weeks.  Please call us at [phone] if you have not heard about the biopsies in 3 weeks.    SIGNATURES/CONFIDENTIALITY: You and/or your care partner have signed paperwork which will be entered into your electronic medical record.  These signatures attest to the fact that that the information above on your After Visit Summary has been reviewed and is understood.  Full responsibility of the confidentiality of this discharge information lies  with you and/or your care-partner.

## 2019-07-18 NOTE — Progress Notes (Signed)
PT taken to PACU. Monitors in place. VSS. Report given to RN. 

## 2019-07-18 NOTE — Progress Notes (Signed)
Temp JB  vs CW  I have reviewed the patient's medical history in detail and updated the computerized patient record.

## 2019-07-19 ENCOUNTER — Telehealth: Payer: Self-pay

## 2019-07-19 DIAGNOSIS — Z6833 Body mass index (BMI) 33.0-33.9, adult: Secondary | ICD-10-CM | POA: Diagnosis not present

## 2019-07-19 DIAGNOSIS — Z01419 Encounter for gynecological examination (general) (routine) without abnormal findings: Secondary | ICD-10-CM | POA: Diagnosis not present

## 2019-07-19 DIAGNOSIS — Z1231 Encounter for screening mammogram for malignant neoplasm of breast: Secondary | ICD-10-CM | POA: Diagnosis not present

## 2019-07-19 NOTE — Telephone Encounter (Signed)
Called and offered pt hemorrhoid banding appts. She indicated they are not bothering her at this time but if that changes she will call us to get scheduled. Answered all her questions re: the banding procedure.

## 2019-07-19 NOTE — Telephone Encounter (Signed)
-----   Message from Yetta Flock, MD sent at 07/18/2019  3:54 PM EST ----- Regarding: banding Hi Jan, Would you be able to contact this patient sometime to coordinate hemorrhoid banding appointment? Thanks much

## 2019-07-20 ENCOUNTER — Telehealth: Payer: Self-pay

## 2019-07-20 NOTE — Telephone Encounter (Signed)
  Follow up Call-  Call back number 07/18/2019  Post procedure Call Back phone  # [phone]  Permission to leave phone message Yes  Some recent data might be hidden     Patient questions:  Do you have a fever, pain , or abdominal swelling? No. Pain Score  0 *  Have you tolerated food without any problems? Yes.    Have you been able to return to your normal activities? Yes.    Do you have any questions about your discharge instructions: Diet   No. Medications  No. Follow up visit  No.  Do you have questions or concerns about your Care? No.  Actions: * If pain score is 4 or above: No action needed, pain <4.  1. Have you developed a fever since your procedure? no  2.   Have you had an respiratory symptoms (SOB or cough) since your procedure? no  3.   Have you tested positive for COVID 19 since your procedure no  4.   Have you had any family members/close contacts diagnosed with the COVID 19 since your procedure?  no   If yes to any of these questions please route to Joylene John, RN and Alphonsa Gin, Therapist, sports.

## 2019-07-20 NOTE — Telephone Encounter (Signed)
Okay thanks Jan 

## 2019-07-21 ENCOUNTER — Encounter: Payer: Self-pay | Admitting: Gastroenterology

## 2019-08-11 DIAGNOSIS — L578 Other skin changes due to chronic exposure to nonionizing radiation: Secondary | ICD-10-CM | POA: Diagnosis not present

## 2019-08-11 DIAGNOSIS — L821 Other seborrheic keratosis: Secondary | ICD-10-CM | POA: Diagnosis not present

## 2019-08-11 DIAGNOSIS — D225 Melanocytic nevi of trunk: Secondary | ICD-10-CM | POA: Diagnosis not present

## 2019-08-11 DIAGNOSIS — L570 Actinic keratosis: Secondary | ICD-10-CM | POA: Diagnosis not present

## 2019-12-04 DIAGNOSIS — K5900 Constipation, unspecified: Secondary | ICD-10-CM | POA: Diagnosis not present

## 2019-12-04 DIAGNOSIS — G4700 Insomnia, unspecified: Secondary | ICD-10-CM | POA: Diagnosis not present

## 2019-12-04 DIAGNOSIS — I10 Essential (primary) hypertension: Secondary | ICD-10-CM | POA: Diagnosis not present

## 2019-12-04 DIAGNOSIS — E7800 Pure hypercholesterolemia, unspecified: Secondary | ICD-10-CM | POA: Diagnosis not present

## 2020-03-14 DIAGNOSIS — R05 Cough: Secondary | ICD-10-CM | POA: Diagnosis not present

## 2020-03-14 DIAGNOSIS — B974 Respiratory syncytial virus as the cause of diseases classified elsewhere: Secondary | ICD-10-CM | POA: Diagnosis not present

## 2020-03-14 DIAGNOSIS — Z20822 Contact with and (suspected) exposure to covid-19: Secondary | ICD-10-CM | POA: Diagnosis not present

## 2020-06-16 DIAGNOSIS — S93492A Sprain of other ligament of left ankle, initial encounter: Secondary | ICD-10-CM | POA: Diagnosis not present

## 2020-06-28 DIAGNOSIS — E7800 Pure hypercholesterolemia, unspecified: Secondary | ICD-10-CM | POA: Diagnosis not present

## 2020-06-28 DIAGNOSIS — R8290 Unspecified abnormal findings in urine: Secondary | ICD-10-CM | POA: Diagnosis not present

## 2020-06-28 DIAGNOSIS — M8588 Other specified disorders of bone density and structure, other site: Secondary | ICD-10-CM | POA: Diagnosis not present

## 2020-06-28 DIAGNOSIS — K5900 Constipation, unspecified: Secondary | ICD-10-CM | POA: Diagnosis not present

## 2020-06-28 DIAGNOSIS — I10 Essential (primary) hypertension: Secondary | ICD-10-CM | POA: Diagnosis not present

## 2020-07-22 DIAGNOSIS — Z6832 Body mass index (BMI) 32.0-32.9, adult: Secondary | ICD-10-CM | POA: Diagnosis not present

## 2020-07-22 DIAGNOSIS — Z13820 Encounter for screening for osteoporosis: Secondary | ICD-10-CM | POA: Diagnosis not present

## 2020-07-22 DIAGNOSIS — Z01419 Encounter for gynecological examination (general) (routine) without abnormal findings: Secondary | ICD-10-CM | POA: Diagnosis not present

## 2020-08-14 DIAGNOSIS — D2371 Other benign neoplasm of skin of right lower limb, including hip: Secondary | ICD-10-CM | POA: Diagnosis not present

## 2020-08-14 DIAGNOSIS — D2271 Melanocytic nevi of right lower limb, including hip: Secondary | ICD-10-CM | POA: Diagnosis not present

## 2020-08-14 DIAGNOSIS — L814 Other melanin hyperpigmentation: Secondary | ICD-10-CM | POA: Diagnosis not present

## 2020-08-14 DIAGNOSIS — L821 Other seborrheic keratosis: Secondary | ICD-10-CM | POA: Diagnosis not present

## 2020-08-29 DIAGNOSIS — Z1231 Encounter for screening mammogram for malignant neoplasm of breast: Secondary | ICD-10-CM | POA: Diagnosis not present

## 2020-08-30 ENCOUNTER — Telehealth: Payer: Self-pay

## 2020-08-30 NOTE — Telephone Encounter (Signed)
error 

## 2020-09-09 DIAGNOSIS — E559 Vitamin D deficiency, unspecified: Secondary | ICD-10-CM | POA: Diagnosis not present

## 2020-09-09 DIAGNOSIS — R635 Abnormal weight gain: Secondary | ICD-10-CM | POA: Diagnosis not present

## 2020-09-09 DIAGNOSIS — E785 Hyperlipidemia, unspecified: Secondary | ICD-10-CM | POA: Diagnosis not present

## 2020-09-09 DIAGNOSIS — N951 Menopausal and female climacteric states: Secondary | ICD-10-CM | POA: Diagnosis not present

## 2020-09-09 DIAGNOSIS — H1033 Unspecified acute conjunctivitis, bilateral: Secondary | ICD-10-CM | POA: Diagnosis not present

## 2020-09-11 DIAGNOSIS — Z1339 Encounter for screening examination for other mental health and behavioral disorders: Secondary | ICD-10-CM | POA: Diagnosis not present

## 2020-09-11 DIAGNOSIS — E785 Hyperlipidemia, unspecified: Secondary | ICD-10-CM | POA: Diagnosis not present

## 2020-09-11 DIAGNOSIS — Z1331 Encounter for screening for depression: Secondary | ICD-10-CM | POA: Diagnosis not present

## 2020-09-11 DIAGNOSIS — E559 Vitamin D deficiency, unspecified: Secondary | ICD-10-CM | POA: Diagnosis not present

## 2020-09-23 DIAGNOSIS — Z6833 Body mass index (BMI) 33.0-33.9, adult: Secondary | ICD-10-CM | POA: Diagnosis not present

## 2020-09-23 DIAGNOSIS — I10 Essential (primary) hypertension: Secondary | ICD-10-CM | POA: Diagnosis not present

## 2020-09-30 DIAGNOSIS — Z6832 Body mass index (BMI) 32.0-32.9, adult: Secondary | ICD-10-CM | POA: Diagnosis not present

## 2020-09-30 DIAGNOSIS — E785 Hyperlipidemia, unspecified: Secondary | ICD-10-CM | POA: Diagnosis not present

## 2020-10-07 DIAGNOSIS — Z6831 Body mass index (BMI) 31.0-31.9, adult: Secondary | ICD-10-CM | POA: Diagnosis not present

## 2020-10-07 DIAGNOSIS — E559 Vitamin D deficiency, unspecified: Secondary | ICD-10-CM | POA: Diagnosis not present

## 2020-10-07 DIAGNOSIS — I10 Essential (primary) hypertension: Secondary | ICD-10-CM | POA: Diagnosis not present

## 2020-10-14 DIAGNOSIS — E785 Hyperlipidemia, unspecified: Secondary | ICD-10-CM | POA: Diagnosis not present

## 2020-10-14 DIAGNOSIS — Z6831 Body mass index (BMI) 31.0-31.9, adult: Secondary | ICD-10-CM | POA: Diagnosis not present

## 2020-10-21 DIAGNOSIS — Z6831 Body mass index (BMI) 31.0-31.9, adult: Secondary | ICD-10-CM | POA: Diagnosis not present

## 2020-10-21 DIAGNOSIS — E785 Hyperlipidemia, unspecified: Secondary | ICD-10-CM | POA: Diagnosis not present

## 2020-10-28 DIAGNOSIS — Z6830 Body mass index (BMI) 30.0-30.9, adult: Secondary | ICD-10-CM | POA: Diagnosis not present

## 2020-10-28 DIAGNOSIS — I10 Essential (primary) hypertension: Secondary | ICD-10-CM | POA: Diagnosis not present

## 2020-11-04 DIAGNOSIS — Z6830 Body mass index (BMI) 30.0-30.9, adult: Secondary | ICD-10-CM | POA: Diagnosis not present

## 2020-11-04 DIAGNOSIS — E785 Hyperlipidemia, unspecified: Secondary | ICD-10-CM | POA: Diagnosis not present

## 2020-11-11 DIAGNOSIS — E559 Vitamin D deficiency, unspecified: Secondary | ICD-10-CM | POA: Diagnosis not present

## 2020-11-11 DIAGNOSIS — Z6830 Body mass index (BMI) 30.0-30.9, adult: Secondary | ICD-10-CM | POA: Diagnosis not present

## 2020-11-18 DIAGNOSIS — Z6830 Body mass index (BMI) 30.0-30.9, adult: Secondary | ICD-10-CM | POA: Diagnosis not present

## 2020-11-18 DIAGNOSIS — I10 Essential (primary) hypertension: Secondary | ICD-10-CM | POA: Diagnosis not present

## 2020-11-25 DIAGNOSIS — Z6830 Body mass index (BMI) 30.0-30.9, adult: Secondary | ICD-10-CM | POA: Diagnosis not present

## 2020-11-25 DIAGNOSIS — E559 Vitamin D deficiency, unspecified: Secondary | ICD-10-CM | POA: Diagnosis not present

## 2020-12-10 DIAGNOSIS — E785 Hyperlipidemia, unspecified: Secondary | ICD-10-CM | POA: Diagnosis not present

## 2020-12-10 DIAGNOSIS — Z6829 Body mass index (BMI) 29.0-29.9, adult: Secondary | ICD-10-CM | POA: Diagnosis not present

## 2020-12-31 DIAGNOSIS — I10 Essential (primary) hypertension: Secondary | ICD-10-CM | POA: Diagnosis not present

## 2020-12-31 DIAGNOSIS — E7800 Pure hypercholesterolemia, unspecified: Secondary | ICD-10-CM | POA: Diagnosis not present

## 2020-12-31 DIAGNOSIS — K5900 Constipation, unspecified: Secondary | ICD-10-CM | POA: Diagnosis not present

## 2020-12-31 DIAGNOSIS — M8588 Other specified disorders of bone density and structure, other site: Secondary | ICD-10-CM | POA: Diagnosis not present

## 2021-01-10 DIAGNOSIS — Z20822 Contact with and (suspected) exposure to covid-19: Secondary | ICD-10-CM | POA: Diagnosis not present

## 2021-01-10 DIAGNOSIS — U071 COVID-19: Secondary | ICD-10-CM | POA: Diagnosis not present

## 2021-01-17 ENCOUNTER — Emergency Department (HOSPITAL_COMMUNITY): Payer: BC Managed Care – PPO

## 2021-01-17 ENCOUNTER — Encounter (HOSPITAL_COMMUNITY): Payer: Self-pay | Admitting: Emergency Medicine

## 2021-01-17 ENCOUNTER — Emergency Department (HOSPITAL_COMMUNITY)
Admission: EM | Admit: 2021-01-17 | Discharge: 2021-01-17 | Disposition: A | Discharge status: Home / Self Care | Payer: BC Managed Care – PPO | Attending: Emergency Medicine | Admitting: Emergency Medicine

## 2021-01-17 ENCOUNTER — Other Ambulatory Visit: Payer: Self-pay

## 2021-01-17 DIAGNOSIS — Z85828 Personal history of other malignant neoplasm of skin: Secondary | ICD-10-CM | POA: Insufficient documentation

## 2021-01-17 DIAGNOSIS — R0789 Other chest pain: Secondary | ICD-10-CM | POA: Diagnosis not present

## 2021-01-17 DIAGNOSIS — R079 Chest pain, unspecified: Secondary | ICD-10-CM | POA: Diagnosis not present

## 2021-01-17 DIAGNOSIS — I509 Heart failure, unspecified: Secondary | ICD-10-CM | POA: Insufficient documentation

## 2021-01-17 DIAGNOSIS — Z8616 Personal history of COVID-19: Secondary | ICD-10-CM | POA: Insufficient documentation

## 2021-01-17 DIAGNOSIS — I110 Hypertensive heart disease with heart failure: Secondary | ICD-10-CM | POA: Insufficient documentation

## 2021-01-17 DIAGNOSIS — R0602 Shortness of breath: Secondary | ICD-10-CM | POA: Diagnosis not present

## 2021-01-17 DIAGNOSIS — Z79899 Other long term (current) drug therapy: Secondary | ICD-10-CM | POA: Insufficient documentation

## 2021-01-17 DIAGNOSIS — R000 Tachycardia, unspecified: Secondary | ICD-10-CM | POA: Insufficient documentation

## 2021-01-17 DIAGNOSIS — Z7982 Long term (current) use of aspirin: Secondary | ICD-10-CM | POA: Insufficient documentation

## 2021-01-17 DIAGNOSIS — R911 Solitary pulmonary nodule: Secondary | ICD-10-CM | POA: Diagnosis not present

## 2021-01-17 DIAGNOSIS — R0609 Other forms of dyspnea: Secondary | ICD-10-CM | POA: Diagnosis not present

## 2021-01-17 LAB — COMPREHENSIVE METABOLIC PANEL
ALT: 26 U/L (ref 0–44)
AST: 18 U/L (ref 15–41)
Albumin: 4.1 g/dL (ref 3.5–5.0)
Alkaline Phosphatase: 77 U/L (ref 38–126)
Anion gap: 9 (ref 5–15)
BUN: 13 mg/dL (ref 6–20)
CO2: 25 mmol/L (ref 22–32)
Calcium: 9.9 mg/dL (ref 8.9–10.3)
Chloride: 104 mmol/L (ref 98–111)
Creatinine, Ser: 0.8 mg/dL (ref 0.44–1.00)
GFR, Estimated: >60 mL/min (ref >60)
Glucose, Bld: 104 mg/dL — ABNORMAL HIGH (ref 70–99)
Potassium: 3.9 mmol/L (ref 3.5–5.1)
Sodium: 138 mmol/L (ref 135–145)
Total Bilirubin: 1.1 mg/dL (ref 0.3–1.2)
Total Protein: 7.3 g/dL (ref 6.5–8.1)

## 2021-01-17 LAB — CBC WITH DIFFERENTIAL/PLATELET
Abs Immature Granulocytes: 0.01 10*3/uL (ref 0.00–0.07)
Basophils Absolute: 0 10*3/uL (ref 0.0–0.1)
Basophils Relative: 0 %
Eosinophils Absolute: 0.1 10*3/uL (ref 0.0–0.5)
Eosinophils Relative: 2 %
HCT: 42.1 % (ref 36.0–46.0)
Hemoglobin: 13.9 g/dL (ref 12.0–15.0)
Immature Granulocytes: 0 %
Lymphocytes Relative: 26 %
Lymphs Abs: 1.7 10*3/uL (ref 0.7–4.0)
MCH: 29 pg (ref 26.0–34.0)
MCHC: 33 g/dL (ref 30.0–36.0)
MCV: 87.9 fL (ref 80.0–100.0)
Monocytes Absolute: 0.6 10*3/uL (ref 0.1–1.0)
Monocytes Relative: 10 %
Neutro Abs: 4.1 10*3/uL (ref 1.7–7.7)
Neutrophils Relative %: 62 %
Platelets: 244 10*3/uL (ref 150–400)
RBC: 4.79 MIL/uL (ref 3.87–5.11)
RDW: 12.6 % (ref 11.5–15.5)
WBC: 6.6 10*3/uL (ref 4.0–10.5)
nRBC: 0 % (ref 0.0–0.2)

## 2021-01-17 LAB — TROPONIN I (HIGH SENSITIVITY)
Troponin I (High Sensitivity): 4 ng/L (ref <18)
Troponin I (High Sensitivity): 5 ng/L (ref <18)

## 2021-01-17 LAB — BRAIN NATRIURETIC PEPTIDE: B Natriuretic Peptide: 29.7 pg/mL (ref 0.0–100.0)

## 2021-01-17 LAB — D-DIMER, QUANTITATIVE: D-Dimer, Quant: 1.22 ug/mL-FEU — ABNORMAL HIGH (ref 0.00–0.50)

## 2021-01-17 MED ORDER — IOHEXOL 350 MG/ML SOLN
80.0000 mL | Freq: Once | INTRAVENOUS | Status: AC | PRN
  Administered 2021-01-17: 80 mL via INTRAVENOUS

## 2021-01-17 NOTE — ED Provider Notes (Signed)
Emergency Medicine Provider Triage Evaluation Note  Joanne Brewer 60 y.o. female was evaluated in triage.  Pt complains of palpitations, intermittent chest pain, shortness of breath that has been ongoing for the past week.  Patient reports that about a week ago, she tested positive for COVID.  She states over the last week, she has noticed that at times, when she gets up and moves around, her heart rate will get very high.  She reports that with these episodes, she will get slightly short of breath.  If she sits down, the symptoms are resolved.  She is also had intermittent chest pain that she describes as a chest tightness.  This has resolved on its own as well.  She went to her doctor's office or later today for evaluation of symptoms.  They and walked around noted her heart rate to be in the 180s and sent her to the emergency department for further evaluation. She denies any OCP use, recent immobilization, prior history of DVT/PE, recent surgery, leg swelling, or long travel.  She has a history of postpartum cardiomyopathy.   Review of Systems  Positive: Palpitations, CP, OSB Negative: Fevers, leg swelling, nausea/vomiting  Physical Exam  BP 134/82   Pulse 70   Temp 98.2 F (36.8 C) (Oral)   Resp 18   Ht 5\' 4"  (1.626 m)   Wt 65.8 kg   SpO2 100%   BMI 24.89 kg/m  Gen:   Awake, no distress   HEENT:  Atraumatic  Resp:  Normal effort.  Lungs CTA B Cardiac:  Normal rate and rhythm Abd:   Nondistended, nontender  MSK:   Moves extremities without difficulty  Neuro:  Speech clear   Other:   No swelling noted to bilateral lower extremities.  Medical Decision Making  Medically screening exam initiated at 11:56 AM  Appropriate orders placed.  Kassity Woodson was informed that the remainder of the evaluation will be completed by another provider, this initial triage assessment does not replace that evaluation. They are counseled that they will need to remain in the ED until the completion of  their workup, including full H&P and results of any tests.  Risks of leaving the emergency department prior to completion of treatment were discussed. Patient was advised to inform ED staff if they are leaving before their treatment is complete. The patient acknowledged these risks and time was allowed for questions.     The patient appears stable so that the remainder of the MSE may be completed by another provider.    Clinical Impression  Palpitations, chest pain, shortness of breath.   Portions of this note were generated with [employer]. Dictation errors may occur despite best attempts at proofreading.     Volanda Napoleon, PA-C 01/17/21 1159    Valarie Merino, MD 01/19/21 [phone]

## 2021-01-17 NOTE — ED Triage Notes (Signed)
Patient complains of tachycardia and shortness of breath that begins when she starts to walk more than a few steps and is resolved by sitting back down. HR 79 in triage. Patient alert, oriented, and in no apparent distress at this time.

## 2021-01-17 NOTE — ED Provider Notes (Signed)
[facility] EMERGENCY DEPARTMENT Provider Note   CSN: [id number] Arrival date & time: 01/17/21  1147     History Chief Complaint  Patient presents with   Tachycardia    Joanne Brewer is a 60 y.o. female.  HPI 60 year old female with a history of cardiomegaly, fibroids, hyperlipidemia, hypertension tachycardia shortness of breath in setting of recent COVID-19 infection.  Patient states that she tested positive for COVID on 7/1.  She states that over the last week, she has been monitoring her oxygen levels at home and has noticed that her heart rate gets very high when she gets up and moves around.  She also endorses associated shortness of breath when she walks around.  Does endorse intermittent chest pain and chest tightness.  When she sits down, symptoms are resolved.  She went to her doctor's office today, she stated that her work-up was overall reassuring but when she is ambulated her heart rate jumped up to 120 and her PCP sent her to the ER.  Denies any OCP use, history of DVT/PE, surgery, recent travel or leg swelling.  She does endorse a history of postpartum cardiomyopathy, and was followed by Dr. Wynonia Lawman for that.    Past Medical History:  Diagnosis Date   Cancer ([place])    basil cell   Cardiomegaly 1991   POSTPARTUM - after vaginal delivery   CHF (congestive heart failure) ([place]) 1992   POSTPARTUM- after vaginal delivery   Fibroids    Hyperlipidemia    Hypertension    POSTPARTUM   Osteopenia     There are no problems to display for this patient.   Past Surgical History:  Procedure Laterality Date   CESAREAN SECTION     2   COLONOSCOPY  05/2006   Sharlett Iles   HEMORRHOIDECTOMY WITH HEMORRHOID BANDING     POLYPECTOMY     right shoulder surgery Right    sacrocoloplasty     TUBAL LIGATION     WISDOM TOOTH EXTRACTION       OB History   No obstetric history on file.     Family History  Problem Relation Age of Onset   Hypertension Mother     CVA Mother    AAA (abdominal aortic aneurysm) Mother    Hypertension Father    CVA Father    Colon cancer Neg Hx    Esophageal cancer Neg Hx    Stomach cancer Neg Hx    Colon polyps Neg Hx    Rectal cancer Neg Hx     Social History   Tobacco Use   Smoking status: Never   Smokeless tobacco: Never  Vaping Use   Vaping Use: Never used  Substance Use Topics   Alcohol use: Yes    Comment: wine1-2/month   Drug use: No    Home Medications Prior to Admission medications   Medication Sig Start Date End Date Taking? Authorizing Provider  acetaminophen (TYLENOL) 500 MG tablet Take 1,000 mg by mouth as needed (pain). 06/09/17  Yes [provider]  aspirin 81 MG EC tablet Take 81 mg by mouth daily.   Yes [provider]  atorvastatin (LIPITOR) 10 MG tablet Take 10 mg by mouth at bedtime.   Yes [provider]  CALCIUM PO Take 1 tablet by mouth daily.   Yes [provider]  cetirizine (ZYRTEC) 10 MG chewable tablet Chew 10 mg by mouth at bedtime.   Yes [provider]  Cholecalciferol (VITAMIN D-3 PO) Take 2 tablets  by mouth daily.   Yes [provider]  Docusate Sodium (DSS) 100 MG CAPS Take 300 mg by mouth at bedtime. 06/09/17  Yes [provider]  eszopiclone (LUNESTA) 1 MG TABS tablet Take 1 mg by mouth at bedtime as needed for sleep. 12/31/20  Yes [provider]  losartan-hydrochlorothiazide (HYZAAR) 100-12.5 MG tablet Take 0.5 tablets by mouth daily.   Yes [provider]    Allergies    Ciprofloxacin  Review of Systems   Review of Systems  Constitutional:  Negative for chills and fever.  HENT:  Negative for ear pain and sore throat.   Eyes:  Negative for pain and visual disturbance.  Respiratory:  Positive for shortness of breath. Negative for cough.   Cardiovascular:  Positive for chest pain and palpitations.  Gastrointestinal:  Negative for abdominal pain and vomiting.  Genitourinary:   Negative for dysuria and hematuria.  Musculoskeletal:  Negative for arthralgias and back pain.  Skin:  Negative for color change and rash.  Neurological:  Negative for seizures and syncope.  All other systems reviewed and are negative.  Physical Exam Updated Vital Signs BP 124/89   Pulse 67   Temp 98.5 F (36.9 C) (Oral)   Resp 13   LMP 05/09/2016 (Approximate)   SpO2 100%   Physical Exam Vitals and nursing note reviewed.  Constitutional:      General: She is not in acute distress.    Appearance: She is well-developed.  HENT:     Head: Normocephalic and atraumatic.  Eyes:     Conjunctiva/sclera: Conjunctivae normal.  Cardiovascular:     Rate and Rhythm: Normal rate and regular rhythm.     Heart sounds: No murmur heard. Pulmonary:     Effort: Pulmonary effort is normal. No respiratory distress.     Breath sounds: Normal breath sounds.  Abdominal:     Palpations: Abdomen is soft.     Tenderness: There is no abdominal tenderness.  Musculoskeletal:        General: Normal range of motion.     Cervical back: Neck supple.  Skin:    General: Skin is warm and dry.  Neurological:     General: No focal deficit present.     Mental Status: She is alert and oriented to person, place, and time.     Sensory: No sensory deficit.     Motor: No weakness.    ED Results / Procedures / Treatments   Labs (all labs ordered are listed, but only abnormal results are displayed) Labs Reviewed  COMPREHENSIVE METABOLIC PANEL - Abnormal; Notable for the following components:      Result Value   Glucose, Bld 104 (*)    All other components within normal limits  D-DIMER, QUANTITATIVE - Abnormal; Notable for the following components:   D-Dimer, Quant 1.22 (*)    All other components within normal limits  CBC WITH DIFFERENTIAL/PLATELET  BRAIN NATRIURETIC PEPTIDE  TROPONIN I (HIGH SENSITIVITY)  TROPONIN I (HIGH SENSITIVITY)    EKG EKG Interpretation  Date/Time:  Friday January 17 2021  11:52:30 EDT Ventricular Rate:  74 PR Interval:  154 QRS Duration: 78 QT Interval:  358 QTC Calculation: 397 R Axis:   -6 Text Interpretation: Normal sinus rhythm with sinus arrhythmia Normal ECG agree, no sig change from previous Confirmed by Charlesetta Shanks [phone]) on 01/17/2021 1:43:39 PM  Radiology DG Chest 2 View  Result Date: 01/17/2021 CLINICAL DATA:  Chest pain EXAM: CHEST - 2 VIEW COMPARISON:  Radiograph 12/10/2011  FINDINGS: The heart size and mediastinal contours are within normal limits. Both lungs are clear. The visualized skeletal structures are unremarkable. IMPRESSION: No evidence of acute cardiopulmonary disease. Electronically Signed   By: Maurine Simmering   On: 01/17/2021 12:51    Procedures Procedures   Medications Ordered in ED Medications - No data to display  ED Course  I have reviewed the triage vital signs and the nursing notes.  Pertinent labs & imaging results that were available during my care of the patient were reviewed by me and considered in my medical decision making (see chart for details).    MDM Rules/Calculators/A&P                          60 year old female with echocardiogram a chest pain and shortness of breath on ambulation.  On arrival, she is well-appearing, no acute distress, resting comfortably in the ER bed.  Vitals overall reassuring.  No evidence of tachycardia here.  No evidence of hypoxia.  Physical exam overall reassuring, lung sounds clear  DDx includes deconditioning due to COVID-19, cardiomyopathy, PE, pericardial effusion  CBC and CMP largely unremarkable, initial troponin of 4, normal BNP.  D-dimer elevated at 1.22.  Chest x-ray without any evidence of pneumonia, pericardial effusion, or any other acute abnormality.  EKG normal sinus rhythm.  Plan for CT angio, ambulation and reassessment of heart rate.  Signed out to [employer] who will oversee the rest of her care, and determine appropriate dispo.  If the patient's heart rate  remains normal, PE study is normal, I suspect she will be stable for discharge.  If she does remain tachycardic with ambulation, she may require cardiology consult.  Discussed with Dr. Vallery Ridge who is agreeable to the above plan and disposition Final Clinical Impression(s) / ED Diagnoses Final diagnoses:  None    Rx / DC Orders ED Discharge Orders     None        Garald Balding, PA-C 01/17/21 1519    Charlesetta Shanks, MD 02/06/21 [phone]

## 2021-01-17 NOTE — Discharge Instructions (Signed)
As discussed, your evaluation today has been largely reassuring.  But, it is important that you monitor your condition carefully, and do not hesitate to return to the ED if you develop new, or concerning changes in your condition.  Otherwise, please follow-up with your physician and with our cardiology colleagues for appropriate ongoing care.  Below is the interpretation from today's CT scan.  Please discuss this with your physician to ensure appropriate ongoing follow-up.  CT IMPRESSION: 1. No evidence of pulmonary embolism or other acute abnormality in the chest. 2. Tiny right middle lobe pulmonary nodules measuring up to 4 mm. No follow-up needed if patient is low-risk (and has no known or suspected primary neoplasm). Non-contrast chest CT can be considered in 12 months if patient is high-risk. This recommendation follows the consensus statement: Guidelines for Management of Incidental Pulmonary Nodules Detected on CT Images: From the Fleischner Society 2017; Radiology 2017; 284:228-243. 3.  Aortic Atherosclerosis (ICD10-I70.0).

## 2021-01-17 NOTE — ED Provider Notes (Signed)
Care assumed from [facility] at shift change.  Please see her note for more detailed history.  Patient is COVID-positive since July 1, she is tachycardic to 180s at home per the patient.  She was tachycardic to 120 at her PCR PE office with ambulation earlier today so sent to the ED for further evaluation.    H/O postpartum cardiomyopathy, not currently followed.   Physical Exam  BP 124/89   Pulse 67   Temp 98.5 F (36.9 C) (Oral)   Resp 13   LMP 05/09/2016 (Approximate)   SpO2 100%   Physical Exam Vitals and nursing note reviewed. Exam conducted with a chaperone present.  Constitutional:      General: She is not in acute distress.    Appearance: Normal appearance.  HENT:     Head: Normocephalic and atraumatic.  Eyes:     General: No scleral icterus.    Extraocular Movements: Extraocular movements intact.     Pupils: Pupils are equal, round, and reactive to light.  Cardiovascular:     Rate and Rhythm: Normal rate and regular rhythm.  Pulmonary:     Effort: Pulmonary effort is normal.     Breath sounds: Normal breath sounds.  Skin:    Coloration: Skin is not jaundiced.  Neurological:     Mental Status: She is alert. Mental status is at baseline.     Coordination: Coordination normal.    ED Course/Procedures    Procedures  MDM  Patient is stable vitals, but her D-dimer was elevated.  CTA is pending currently.  Patient is not having any chest pain.  Work-up has been reassuring, troponin is low and I doubt ACS.  CTA is pending.  Patient care is signed out to Dr. Vanita Panda who will follow the results and plan according dispo.       Sherrill Raring, PA-C 01/17/21 1604    Carmin Muskrat, MD 01/17/21 [phone]

## 2021-01-22 ENCOUNTER — Ambulatory Visit (INDEPENDENT_AMBULATORY_CARE_PROVIDER_SITE_OTHER): Payer: BC Managed Care – PPO | Admitting: Cardiology

## 2021-01-22 ENCOUNTER — Encounter: Payer: Self-pay | Admitting: *Deleted

## 2021-01-22 ENCOUNTER — Other Ambulatory Visit: Payer: Self-pay

## 2021-01-22 ENCOUNTER — Encounter: Payer: Self-pay | Admitting: Cardiology

## 2021-01-22 ENCOUNTER — Ambulatory Visit (INDEPENDENT_AMBULATORY_CARE_PROVIDER_SITE_OTHER): Payer: BC Managed Care – PPO

## 2021-01-22 VITALS — BP 114/84 | HR 106 | Ht 64.0 in | Wt 170.0 lb

## 2021-01-22 DIAGNOSIS — R002 Palpitations: Secondary | ICD-10-CM | POA: Diagnosis not present

## 2021-01-22 DIAGNOSIS — I10 Essential (primary) hypertension: Secondary | ICD-10-CM | POA: Diagnosis not present

## 2021-01-22 DIAGNOSIS — R0602 Shortness of breath: Secondary | ICD-10-CM

## 2021-01-22 NOTE — Progress Notes (Unsigned)
Patient enrolled for [employer] to mail a 7 day ZIO XT monitor to address on file.

## 2021-01-22 NOTE — Progress Notes (Signed)
Joanne Forts, MD Reason for referral-palpitations, chest pain and dyspnea.  HPI: 60 year old female for evaluation of palpitations, chest pain and dyspnea.  Recently diagnosed with COVID.  Seen in the emergency room July 8 with complaints of palpitations, chest pain and dyspnea.  CTA February 06, 2021 showed no pulmonary embolus and 4 mm nodule with no follow-up recommended with patient lower risk.  Aortic atherosclerosis noted.  Troponins normal.  BNP 30.  Hemoglobin 13.9.  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  However her LV function improved.  She has done well since 2018.  However since having COVID she notes dyspnea on exertion.  No orthopnea, PND or pedal edema.  She also notices that her heart rate is significantly elevated with ambulation.  It can go up into the 140s to 160 range.  She feels her heart racing or some dizziness associated.  No syncope.  She feels some chest heaviness with that as well.  Cardiology now asked to evaluate.  Current Outpatient Medications  Medication Sig Dispense Refill   acetaminophen (TYLENOL) 500 MG tablet Take 1,000 mg by mouth as needed (pain).     aspirin 81 MG EC tablet Take 81 mg by mouth daily.     atorvastatin (LIPITOR) 10 MG tablet Take 10 mg by mouth at bedtime.     CALCIUM PO Take 1 tablet by mouth daily.     cetirizine (ZYRTEC) 10 MG chewable tablet Chew 10 mg by mouth at bedtime.     Cholecalciferol (VITAMIN D-3 PO) Take 2 tablets by mouth daily.     Docusate Sodium (DSS) 100 MG CAPS Take 300 mg by mouth at bedtime.     eszopiclone (LUNESTA) 1 MG TABS tablet Take 1 mg by mouth at bedtime as needed for sleep.     losartan-hydrochlorothiazide (HYZAAR) 100-12.5 MG tablet Take 0.5 tablets by mouth daily.     magnesium gluconate (MAGONATE) 500 MG tablet Take 500 mg by mouth daily.     No current facility-administered medications for this visit.    Allergies  Allergen  Reactions   Ciprofloxacin Other (See Comments)    Pt states she passed out      Past Medical History:  Diagnosis Date   Cancer ([place])    basil cell   Cardiomegaly 1991   POSTPARTUM - after vaginal delivery   CHF (congestive heart failure) ([place]) 1992   POSTPARTUM- after vaginal delivery   Fibroids    Hyperlipidemia    Hypertension    POSTPARTUM   Osteopenia     Past Surgical History:  Procedure Laterality Date   CESAREAN SECTION     2   COLONOSCOPY  05/2006   Sharlett Iles   HEMORRHOIDECTOMY WITH HEMORRHOID BANDING     POLYPECTOMY     right shoulder surgery Right    sacrocoloplasty     TUBAL LIGATION     WISDOM TOOTH EXTRACTION      Social History   Socioeconomic History   Marital status: Single    Spouse name: Not on file   Number of children: Not on file   Years of education: Not on file   Highest education level: Not on file  Occupational History   Not on file  Tobacco Use   Smoking status: Never   Smokeless tobacco: Never  Vaping Use   Vaping Use: Never used  Substance and Sexual Activity   Alcohol use: Yes    Comment: wine1-2/month  Drug use: No   Sexual activity: Not on file  Other Topics Concern   Not on file  Social History Narrative   Not on file   Social Determinants of Health   Financial Resource Strain: Not on file  Food Insecurity: Not on file  Transportation Needs: Not on file  Physical Activity: Not on file  Stress: Not on file  Social Connections: Not on file  Intimate Partner Violence: Not on file    Family History  Problem Relation Age of Onset   Hypertension Mother    CVA Mother    AAA (abdominal aortic aneurysm) Mother    Hypertension Father    CVA Father    Colon cancer Neg Hx    Esophageal cancer Neg Hx    Stomach cancer Neg Hx    Colon polyps Neg Hx    Rectal cancer Neg Hx     ROS: no fevers or chills, productive cough, hemoptysis, dysphasia, odynophagia, melena, hematochezia, dysuria, hematuria, rash, seizure  activity, orthopnea, PND, pedal edema, claudication. Remaining systems are negative.  Physical Exam:   Blood pressure 114/84, pulse (!) 106, height 5\' 4"  (1.626 m), weight 170 lb (77.1 kg), last menstrual period 05/09/2016.  General:  Well developed/well nourished in NAD Skin warm/dry Patient not depressed No peripheral clubbing Back-normal HEENT-normal/normal eyelids Neck supple/normal carotid upstroke bilaterally; no bruits; no JVD; no thyromegaly chest - CTA/ normal expansion CV - RRR/normal S1 and S2; no murmurs, rubs or gallops;  PMI nondisplaced Abdomen -NT/ND, no HSM, no mass, + bowel sounds, no bruit 2+ femoral pulses, no bruits Ext-no edema, chords, 2+ DP Neuro-grossly nonfocal  ECG -sinus tachycardia personally reviewed  A/P  1 dyspnea-patient describes dyspnea on exertion since COVID.  Her previous CTA showed no pulmonary embolus.  I will arrange an echocardiogram to assess LV function.  She is not volume overloaded on examination.  2 palpitations/tachycardia-patient describes significant elevation of heart rate and palpitations with activities.  We will arrange a 7-day monitor to further assess.  Check TSH.  Note recent hemoglobin normal.  3 hypertension-continue present blood pressure medications.  4 hyperlipidemia-continue statin.  5 lung nodule-plan follow-up noncontrast chest CT in 1 year.  Kirk Ruths, MD

## 2021-01-22 NOTE — Patient Instructions (Signed)
Lab Work:  Your physician recommends that you HAVE LAB WORK TODAY  If you have labs (blood work) drawn today and your tests are completely normal, you will receive your results only by: MyChart Message (if you have MyChart) OR A paper copy in the mail If you have any lab test that is abnormal or we need to change your treatment, we will call you to review the results.   Testing/Procedures:  Your physician has requested that you have an echocardiogram. Echocardiography is a painless test that uses sound waves to create images of your heart. It provides your doctor with information about the size and shape of your heart and how well your heart's chambers and valves are working. This procedure takes approximately one hour. There are no restrictions for this procedure. [place] Instructions  Your physician has requested you wear a ZIO patch monitor for 7 days.  This is a single patch monitor. [employer] supplies one patch monitor per enrollment. Additional stickers are not available. Please do not apply patch if you will be having a Nuclear Stress Test,  Echocardiogram, Cardiac CT, MRI, or Chest Xray during the period you would be wearing the  monitor. The patch cannot be worn during these tests. You cannot remove and re-apply the  ZIO XT patch monitor.  Your ZIO patch monitor will be mailed 3 day USPS to your address on file. It may take 3-5 days  to receive your monitor after you have been enrolled.  Once you have received your monitor, please review the enclosed instructions. Your monitor  has already been registered assigning a specific monitor serial # to you.  Billing and Patient Assistance Program Information  We have supplied [employer] with any of your insurance information on file for billing purposes. [employer] offers a sliding scale Patient Assistance Program for patients that do not have  insurance, or whose insurance does not  completely cover the cost of the ZIO monitor.  You must apply for the Patient Assistance Program to qualify for this discounted rate.  To apply, please call [employer] at [phone], select option 4, select option 2, ask to apply for  Patient Assistance Program. Theodore Demark will ask your household income, and how many people  are in your household. They will quote your out-of-pocket cost based on that information.  [employer] will also be able to set up a 34-month, interest-free payment plan if needed.  Applying the monitor   Shave hair from upper left chest.  Hold abrader disc by orange tab. Rub abrader in 40 strokes over the upper left chest as  indicated in your monitor instructions.  Clean area with 4 enclosed alcohol pads. Let dry.  Apply patch as indicated in monitor instructions. Patch will be placed under collarbone on left  side of chest with arrow pointing upward.  Rub patch adhesive wings for 2 minutes. Remove white label marked "1". Remove the white  label marked "2". Rub patch adhesive wings for 2 additional minutes.  While looking in a mirror, press and release button in center of patch. A small green light will  flash 3-4 times. This will be your only indicator that the monitor has been turned on.  Do not shower for the first 24 hours. You may shower after the first 24 hours.  Press the button if you feel a symptom. You will hear a small click. Record Date, Time and  Symptom in the Patient Logbook.  When you are  ready to remove the patch, follow instructions on the last 2 pages of Patient  Logbook. Stick patch monitor onto the last page of Patient Logbook.  Place Patient Logbook in the blue and white box. Use locking tab on box and tape box closed  securely. The blue and white box has prepaid postage on it. Please place it in the mailbox as  soon as possible. Your physician should have your test results approximately 7 days after the  monitor has been mailed back to [employer].  Call  [place] at [phone] if you have questions regarding  your ZIO XT patch monitor. Call them immediately if you see an orange light blinking on your  monitor.  If your monitor falls off in less than 4 days, contact our Monitor department at [phone].  If your monitor becomes loose or falls off after 4 days call [employer] at [phone] for  suggestions on securing your monitor    Follow-Up: At [facility], you and your health needs are our priority.  As part of our continuing mission to provide you with exceptional heart care, we have created designated Provider Care Teams.  These Care Teams include your primary Cardiologist (physician) and Advanced Practice Providers (APPs -  Physician Assistants and Nurse Practitioners) who all work together to provide you with the care you need, when you need it.  We recommend signing up for the patient portal called "MyChart".  Sign up information is provided on this After Visit Summary.  MyChart is used to connect with patients for Virtual Visits (Telemedicine).  Patients are able to view lab/test results, encounter notes, upcoming appointments, etc.  Non-urgent messages can be sent to your provider as well.   To learn more about what you can do with MyChart, go to [email or web address].    Your next appointment:   4-6 week(s)  The format for your next appointment:   In Person  Provider:   Kirk Ruths, MD

## 2021-01-23 ENCOUNTER — Telehealth: Payer: Self-pay | Admitting: Cardiology

## 2021-01-23 ENCOUNTER — Encounter: Payer: Self-pay | Admitting: *Deleted

## 2021-01-23 LAB — TSH: TSH: 3.4 u[IU]/mL (ref 0.450–4.500)

## 2021-01-23 NOTE — Telephone Encounter (Signed)
Called advised patient to not wear the ZIO while having an ECHO, I advised her to wait until the ECHO was completed and then wear the monitor. If patient is able to get it and place it tomorrow she will finish her 7 days, the morning of the ECHO and this would be okay. Otherwise she will wait until after.  Patient verbalized understanding.

## 2021-01-23 NOTE — Telephone Encounter (Signed)
Pt wants to confirm se can wear her heart monitor while having her ECHO.

## 2021-01-24 ENCOUNTER — Ambulatory Visit (HOSPITAL_COMMUNITY): Payer: BC Managed Care – PPO | Attending: Cardiovascular Disease

## 2021-01-24 ENCOUNTER — Other Ambulatory Visit: Payer: Self-pay

## 2021-01-24 DIAGNOSIS — R002 Palpitations: Secondary | ICD-10-CM

## 2021-01-24 DIAGNOSIS — R0602 Shortness of breath: Secondary | ICD-10-CM | POA: Diagnosis not present

## 2021-01-24 LAB — ECHOCARDIOGRAM COMPLETE
Area-P 1/2: 3.12 cm2
S' Lateral: 2.5 cm

## 2021-01-30 ENCOUNTER — Other Ambulatory Visit (HOSPITAL_COMMUNITY): Payer: BC Managed Care – PPO

## 2021-02-04 ENCOUNTER — Telehealth: Payer: Self-pay | Admitting: *Deleted

## 2021-02-04 MED ORDER — METOPROLOL SUCCINATE ER 25 MG PO TB24: 25.0000 mg | ORAL_TABLET | Freq: Every day | ORAL | 3 refills | Status: DC

## 2021-02-04 NOTE — Telephone Encounter (Signed)
Spoke with pt, Aware of dr crenshaw's recommendations. New script sent to the pharmacy  

## 2021-02-04 NOTE — Telephone Encounter (Signed)
-----   Message from Lelon Perla, MD sent at 02/04/2021  9:42 AM EDT ----- Add toprol 25 mg daily Kirk Ruths

## 2021-02-12 ENCOUNTER — Telehealth: Payer: Self-pay | Admitting: Cardiology

## 2021-02-12 NOTE — Telephone Encounter (Signed)
Patient called to see if she suppose to keep taking the  Pt c/o medication issue:  1. Name of Medication: metoprolol succinate (TOPROL XL) 25 MG 24 hr tablet losartan-hydrochlorothiazide (HYZAAR) 100-12.5 MG tablet  2. How are you currently taking this medication (dosage and times per day)? 1 tablet by mouth daily   3. Are you having a reaction (difficulty breathing--STAT)? no  4. What is your medication issue? Patient stated she just started taking the metoprolol and what to know if she still should keep taking Losartan

## 2021-02-12 NOTE — Telephone Encounter (Signed)
Spoke with pt, aware to continue losartan and metoprolol both.

## 2021-02-19 ENCOUNTER — Ambulatory Visit: Payer: BC Managed Care – PPO | Admitting: Cardiology

## 2021-02-24 NOTE — Progress Notes (Signed)
HPI: FU palpitations, chest pain and dyspnea.  Previously diagnosed with COVID.  Seen in the emergency room with complaints of palpitations, chest pain and dyspnea.  CTA January 17, 2021 showed no pulmonary embolus and 4 mm nodule with no follow-up recommended with patient lower risk.  Aortic atherosclerosis noted.  Troponins normal.  BNP 30.  Hemoglobin 13.9.  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  However her LV function improved.  She has done well since 2018.  However since having COVID she notes dyspnea on exertion.  Echocardiogram July 2022 showed normal LV function, grade 1 diastolic dysfunction, mild RV dysfunction.  Monitor July 2022 showed sinus rhythm with occasional PAC, brief PAT, occasional PVC and 4 and 10 beat runs of nonsustained ventricular tachycardia.  Toprol added.  Since last seen, her symptoms are somewhat improved but she is not back to normal.  She continues to have some dyspnea on exertion.  There is no orthopnea, PND or pedal edema.  She has some chest heaviness when climbing stairs but occasionally at rest as well.  She has fatigue which has improved somewhat.  Her heart rate continues to increase with activities but not as severe as previous.  Current Outpatient Medications  Medication Sig Dispense Refill   acetaminophen (TYLENOL) 500 MG tablet Take 1,000 mg by mouth as needed (pain).     aspirin 81 MG EC tablet Take 81 mg by mouth daily.     atorvastatin (LIPITOR) 10 MG tablet Take 10 mg by mouth at bedtime.     CALCIUM PO Take 1 tablet by mouth daily.     cetirizine (ZYRTEC) 10 MG chewable tablet Chew 10 mg by mouth at bedtime.     Cholecalciferol (VITAMIN D-3 PO) Take 2 tablets by mouth daily.     Docusate Sodium (DSS) 100 MG CAPS Take 300 mg by mouth at bedtime.     losartan-hydrochlorothiazide (HYZAAR) 100-12.5 MG tablet Take 0.5 tablets by mouth daily.     magnesium gluconate (MAGONATE) 500 MG  tablet Take 500 mg by mouth daily.     metoprolol succinate (TOPROL XL) 25 MG 24 hr tablet Take 1 tablet (25 mg total) by mouth daily. 90 tablet 3   eszopiclone (LUNESTA) 1 MG TABS tablet Take 1 mg by mouth at bedtime as needed for sleep. (Patient not taking: Reported on 03/05/2021)     No current facility-administered medications for this visit.     Past Medical History:  Diagnosis Date   Cancer ([place])    basil cell   Cardiomegaly 1991   POSTPARTUM - after vaginal delivery   CHF (congestive heart failure) ([place]) 1992   POSTPARTUM- after vaginal delivery   Fibroids    Hyperlipidemia    Hypertension    POSTPARTUM   Osteopenia     Past Surgical History:  Procedure Laterality Date   CESAREAN SECTION     2   COLONOSCOPY  05/2006   Sharlett Iles   HEMORRHOIDECTOMY WITH HEMORRHOID BANDING     POLYPECTOMY     right shoulder surgery Right    sacrocoloplasty     TUBAL LIGATION     WISDOM TOOTH EXTRACTION      Social History   Socioeconomic History   Marital status: Married    Spouse name: Not on file   Number of children: Not on file   Years of education: Not on file   Highest education level: Not on file  Occupational History   Not on file  Tobacco Use   Smoking status: Never   Smokeless tobacco: Never  Vaping Use   Vaping Use: Never used  Substance and Sexual Activity   Alcohol use: Yes    Comment: wine1-2/month   Drug use: No   Sexual activity: Not on file  Other Topics Concern   Not on file  Social History Narrative   Not on file   Social Determinants of Health   Financial Resource Strain: Not on file  Food Insecurity: Not on file  Transportation Needs: Not on file  Physical Activity: Not on file  Stress: Not on file  Social Connections: Not on file  Intimate Partner Violence: Not on file    Family History  Problem Relation Age of Onset   Hypertension Mother    CVA Mother    AAA (abdominal aortic aneurysm) Mother    Hypertension Father    CVA Father     Colon cancer Neg Hx    Esophageal cancer Neg Hx    Stomach cancer Neg Hx    Colon polyps Neg Hx    Rectal cancer Neg Hx     ROS: no fevers or chills, productive cough, hemoptysis, dysphasia, odynophagia, melena, hematochezia, dysuria, hematuria, rash, seizure activity, orthopnea, PND, pedal edema, claudication. Remaining systems are negative.  Physical Exam: Well-developed well-nourished in no acute distress.  Skin is warm and dry.  HEENT is normal.  Neck is supple.  Chest is clear to auscultation with normal expansion.  Cardiovascular exam is regular rate and rhythm.  Abdominal exam nontender or distended. No masses palpated. Extremities show no edema. neuro grossly intact  A/P  1 dyspnea-previous CTA showed no pulmonary embolus.  Echocardiogram shows normal LV function.  She also complains of some chest heaviness with activities occasion current at rest.  She is very concerned about her symptoms.  I will arrange a cardiac CTA to rule out obstructive coronary disease.  Symptoms are slightly improved compared to previous.  2 palpitations/tachycardia-recent monitor as outlined above with occasional PVC and nonsustained ventricular tachycardia.  Continue beta-blocker.  3 hyperlipidemia-continue statin.  4 lung nodule-follow-up noncontrast chest CT 7/23.  Kirk Ruths, MD

## 2021-03-05 ENCOUNTER — Ambulatory Visit (INDEPENDENT_AMBULATORY_CARE_PROVIDER_SITE_OTHER): Payer: BC Managed Care – PPO | Admitting: Cardiology

## 2021-03-05 ENCOUNTER — Encounter: Payer: Self-pay | Admitting: Cardiology

## 2021-03-05 ENCOUNTER — Other Ambulatory Visit: Payer: Self-pay

## 2021-03-05 ENCOUNTER — Encounter: Payer: Self-pay | Admitting: *Deleted

## 2021-03-05 VITALS — BP 112/66 | HR 80 | Ht 64.0 in | Wt 174.0 lb

## 2021-03-05 DIAGNOSIS — E7800 Pure hypercholesterolemia, unspecified: Secondary | ICD-10-CM

## 2021-03-05 DIAGNOSIS — R0602 Shortness of breath: Secondary | ICD-10-CM | POA: Diagnosis not present

## 2021-03-05 DIAGNOSIS — R002 Palpitations: Secondary | ICD-10-CM

## 2021-03-05 DIAGNOSIS — R072 Precordial pain: Secondary | ICD-10-CM

## 2021-03-05 MED ORDER — METOPROLOL TARTRATE 100 MG PO TABS: ORAL_TABLET | ORAL | 0 refills | Status: DC

## 2021-03-05 NOTE — Patient Instructions (Signed)
Lab Work:  Your physician recommends that you return for lab work PRIOR TO [place]  If you have labs (blood work) drawn today and your tests are completely normal, you will receive your results only by: [place] (if you have [place]) OR A paper copy in the mail If you have any lab test that is abnormal or we need to change your treatment, we will call you to review the results.   Testing/Procedures:   Your cardiac CT will be scheduled at   [facility] [address] [place], [place] [address] [phone]  If scheduled at [facility], please arrive at the [facility] main entrance (entrance A) of [facility] 30 minutes prior to test start time. Proceed to the [facility] Radiology Department (first floor) to check-in and test prep.  If scheduled at [facility], please arrive 15 mins early for check-in and test prep.  Please follow these instructions carefully (unless otherwise directed):    On the Night Before the Test: Be sure to Drink plenty of water. Do not consume any caffeinated/decaffeinated beverages or chocolate 12 hours prior to your test. Do not take any antihistamines 12 hours prior to your test.   On the Day of the Test: Drink plenty of water until 1 hour prior to the test. Do not eat any food 4 hours prior to the test. You may take your regular medications prior to the test.  Take metoprolol (Lopressor) 100 MG two hours prior to test. HOLD Furosemide/Hydrochlorothiazide morning of the test. FEMALES- please wear underwire-free bra if available, avoid dresses & tight clothing   After the Test: Drink plenty of water. After receiving IV contrast, you may experience a mild flushed feeling. This is normal. On occasion, you may experience a mild rash up to 24 hours after the test. This is not dangerous. If this occurs, you can take Benadryl 25 mg and increase your fluid intake. If you experience  trouble breathing, this can be serious. If it is severe call 911 IMMEDIATELY. If it is mild, please call our office. If you take any of these medications: Glipizide/Metformin, Avandament, Glucavance, please do not take 48 hours after completing test unless otherwise instructed.  Please allow 2-4 weeks for scheduling of routine cardiac CTs. Some insurance companies require a pre-authorization which may delay scheduling of this test.   For non-scheduling related questions, please contact the cardiac imaging nurse navigator should you have any questions/concerns: Marchia Bond, Cardiac Imaging Nurse Navigator Gordy Clement, Cardiac Imaging Nurse Navigator  Heart [facility] V[facility] Services Direct Office Dial: [phone]   For scheduling needs, including cancellations and rescheduling, please call [place], [phone].    Follow-Up: At [facility], you and your health needs are our priority.  As part of our continuing mission to provide you with exceptional heart care, we have created designated Provider Care Teams.  These Care Teams include your primary Cardiologist (physician) and Advanced Practice Providers (APPs -  Physician Assistants and Nurse Practitioners) who all work together to provide you with the care you need, when you need it.  We recommend signing up for the patient portal called "MyChart".  Sign up information is provided on this After Visit Summary.  MyChart is used to connect with patients for Virtual Visits (Telemedicine).  Patients are able to view lab/test results, encounter notes, upcoming appointments, etc.  Non-urgent messages can be sent to your provider as well.   To learn more about what you can  do with MyChart, go to [email or web address].    Your next appointment:   3 month(s)  The format for your next appointment:   In Person  Provider:   Kirk Ruths, MD

## 2021-03-10 ENCOUNTER — Telehealth (HOSPITAL_COMMUNITY): Payer: Self-pay | Admitting: *Deleted

## 2021-03-10 DIAGNOSIS — R072 Precordial pain: Secondary | ICD-10-CM | POA: Diagnosis not present

## 2021-03-10 DIAGNOSIS — R0602 Shortness of breath: Secondary | ICD-10-CM | POA: Diagnosis not present

## 2021-03-10 NOTE — Telephone Encounter (Signed)
Reaching out to patient to offer assistance regarding upcoming cardiac imaging study; pt verbalizes understanding of appt date/time, parking situation and where to check in, pre-test NPO status and medications ordered, and verified current allergies; name and call back number provided for further questions should they arise ° °Alen Matheson RN Navigator Cardiac Imaging °[facility] °[phone] office °[phone] cell  ° °Patient to take 100mg metoprolol tartrate two hours prior to cardiac CT scan. °

## 2021-03-10 NOTE — Telephone Encounter (Signed)
Attempted to call patient regarding upcoming cardiac CT appointment and to obtain blood work prior to appointment. Left message on voicemail with name and callback number  Gordy Clement RN Navigator Cardiac [facility] [phone] Office [phone] Cell

## 2021-03-11 LAB — BASIC METABOLIC PANEL
BUN/Creatinine Ratio: 22 (ref 9–23)
BUN: 19 mg/dL (ref 6–24)
CO2: 24 mmol/L (ref 20–29)
Calcium: 9.3 mg/dL (ref 8.7–10.2)
Chloride: 102 mmol/L (ref 96–106)
Creatinine, Ser: 0.88 mg/dL (ref 0.57–1.00)
Glucose: 86 mg/dL (ref 65–99)
Potassium: 4.4 mmol/L (ref 3.5–5.2)
Sodium: 139 mmol/L (ref 134–144)
eGFR: 76 mL/min/{1.73_m2} (ref >59)

## 2021-03-12 ENCOUNTER — Other Ambulatory Visit: Payer: Self-pay

## 2021-03-12 ENCOUNTER — Ambulatory Visit (HOSPITAL_COMMUNITY)
Admission: RE | Admit: 2021-03-12 | Discharge: 2021-03-12 | Disposition: A | Discharge status: Home / Self Care | Payer: BC Managed Care – PPO | Source: Ambulatory Visit | Attending: Cardiology | Admitting: Cardiology

## 2021-03-12 DIAGNOSIS — R0602 Shortness of breath: Secondary | ICD-10-CM | POA: Insufficient documentation

## 2021-03-12 DIAGNOSIS — I700 Atherosclerosis of aorta: Secondary | ICD-10-CM | POA: Diagnosis not present

## 2021-03-12 DIAGNOSIS — R072 Precordial pain: Secondary | ICD-10-CM | POA: Diagnosis not present

## 2021-03-12 MED ORDER — NITROGLYCERIN 0.4 MG SL SUBL
0.8000 mg | SUBLINGUAL_TABLET | Freq: Once | SUBLINGUAL | Status: AC
  Administered 2021-03-12: 0.8 mg via SUBLINGUAL

## 2021-03-12 MED ORDER — IOHEXOL 350 MG/ML SOLN
100.0000 mL | Freq: Once | INTRAVENOUS | Status: AC | PRN
  Administered 2021-03-12: 100 mL via INTRAVENOUS

## 2021-03-12 MED ORDER — NITROGLYCERIN 0.4 MG SL SUBL
SUBLINGUAL_TABLET | SUBLINGUAL | Status: AC
  Filled 2021-03-12: qty 2

## 2021-03-13 ENCOUNTER — Telehealth: Payer: Self-pay | Admitting: Cardiology

## 2021-03-13 NOTE — Telephone Encounter (Signed)
Howard Pouch returned call regarding deadling of forms. He stated that they would work with [place], and if we could send patients detailed office visit note from office visit on 8/24 that is the main thing for right now until they get the forms from provider. Informed him we could get that sent over today.

## 2021-03-13 NOTE — Telephone Encounter (Signed)
Spoke with pt, Aware of dr Jacalyn Lefevre recommendations. Aware will place return to work form is at the front desk for pick up.

## 2021-03-13 NOTE — Telephone Encounter (Signed)
[facility] received disability forms from [employer]. Forms were put in Dr.Crenshaw box. [employer] claims specialist was contacted and voicemail was left informing their office we have just received the forms, as soon as they are completed we will fax them back to [employer]. 03/13/21

## 2021-03-13 NOTE — Telephone Encounter (Signed)
Patient would like Hilda Blades to call her about her return to work note.

## 2021-03-13 NOTE — Telephone Encounter (Signed)
Spoke with pt, aware her ct scan has not been read by dr Stanford Breed yet so I can not fill out the paperwork for her to return to work until he gives the okay. She reports her return to work date would be 03/18/21 as Monday is a holiday.

## 2021-03-25 DIAGNOSIS — Z0279 Encounter for issue of other medical certificate: Secondary | ICD-10-CM

## 2021-03-25 NOTE — Telephone Encounter (Signed)
03/25/21 - patient came in office to complete release, billing form, and pay $29 forms fee.    - forms were faxed to [facility], copy was given to patient.  - Email sent to billing department, waiting for payment to be posted.

## 2021-04-07 NOTE — Telephone Encounter (Signed)
Complete

## 2021-06-02 NOTE — Progress Notes (Signed)
HPI: FU palpitations, chest pain and dyspnea. 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  However her LV function improved.  She has done well since 2018.  Previously diagnosed with COVID.  Seen in the emergency room with complaints of palpitations, chest pain and dyspnea.  CTA January 17, 2021 showed no pulmonary embolus and 4 mm nodule with no follow-up recommended with patient lower risk.  Aortic atherosclerosis noted.   However since having COVID she notes dyspnea on exertion.  Echocardiogram July 2022 showed normal LV function, grade 1 diastolic dysfunction, mild RV dysfunction.  Monitor July 2022 showed sinus rhythm with occasional PAC, brief PAT, occasional PVC and 4 and 10 beat runs of nonsustained ventricular tachycardia.  Toprol added.  Coronary CTA 8/22 showed Ca score 1 and 0-24 Lcx. Since last seen, her dyspnea has improved.  No chest pain or syncope.  Occasional feels her heart race with activities.  However this is also improved.  Current Outpatient Medications  Medication Sig Dispense Refill   acetaminophen (TYLENOL) 500 MG tablet Take 1,000 mg by mouth as needed (pain).     aspirin 81 MG EC tablet Take 81 mg by mouth daily.     atorvastatin (LIPITOR) 10 MG tablet Take 10 mg by mouth at bedtime.     CALCIUM PO Take 1 tablet by mouth daily.     cetirizine (ZYRTEC) 10 MG chewable tablet Chew 10 mg by mouth at bedtime.     Cholecalciferol (VITAMIN D-3 PO) Take 2 tablets by mouth daily.     Docusate Sodium (DSS) 100 MG CAPS Take 300 mg by mouth at bedtime.     eszopiclone (LUNESTA) 1 MG TABS tablet Take 1 mg by mouth at bedtime as needed for sleep.     losartan-hydrochlorothiazide (HYZAAR) 100-12.5 MG tablet Take 0.5 tablets by mouth daily.     magnesium gluconate (MAGONATE) 500 MG tablet Take 500 mg by mouth daily.     metoprolol succinate (TOPROL XL) 25 MG 24 hr tablet Take 1 tablet (25 mg total) by mouth daily. 90 tablet 3   metoprolol  tartrate (LOPRESSOR) 100 MG tablet TAKE 2 HOURS PRIOR TO CT SCAN 1 tablet 0   No current facility-administered medications for this visit.     Past Medical History:  Diagnosis Date   Cancer ([place])    basil cell   Cardiomegaly 1991   POSTPARTUM - after vaginal delivery   CHF (congestive heart failure) ([place]) 1992   POSTPARTUM- after vaginal delivery   Fibroids    Hyperlipidemia    Hypertension    POSTPARTUM   Osteopenia     Past Surgical History:  Procedure Laterality Date   CESAREAN SECTION     2   COLONOSCOPY  05/2006   Sharlett Iles   HEMORRHOIDECTOMY WITH HEMORRHOID BANDING     POLYPECTOMY     right shoulder surgery Right    sacrocoloplasty     TUBAL LIGATION     WISDOM TOOTH EXTRACTION      Social History   Socioeconomic History   Marital status: Married    Spouse name: Not on file   Number of children: Not on file   Years of education: Not on file   Highest education level: Not on file  Occupational History   Not on file  Tobacco Use   Smoking status: Never   Smokeless tobacco: Never  Vaping Use   Vaping Use: Never used  Substance and Sexual Activity  Alcohol use: Yes    Comment: wine1-2/month   Drug use: No   Sexual activity: Not on file  Other Topics Concern   Not on file  Social History Narrative   Not on file   Social Determinants of Health   Financial Resource Strain: Not on file  Food Insecurity: Not on file  Transportation Needs: Not on file  Physical Activity: Not on file  Stress: Not on file  Social Connections: Not on file  Intimate Partner Violence: Not on file    Family History  Problem Relation Age of Onset   Hypertension Mother    CVA Mother    AAA (abdominal aortic aneurysm) Mother    Hypertension Father    CVA Father    Colon cancer Neg Hx    Esophageal cancer Neg Hx    Stomach cancer Neg Hx    Colon polyps Neg Hx    Rectal cancer Neg Hx     ROS: no fevers or chills, productive cough, hemoptysis, dysphasia,  odynophagia, melena, hematochezia, dysuria, hematuria, rash, seizure activity, orthopnea, PND, pedal edema, claudication. Remaining systems are negative.  Physical Exam: Well-developed well-nourished in no acute distress.  Skin is warm and dry.  HEENT is normal.  Neck is supple.  Chest is clear to auscultation with normal expansion.  Cardiovascular exam is regular rate and rhythm.  Abdominal exam nontender or distended. No masses palpated. Extremities show no edema. neuro grossly intact   A/P  1 dyspnea-work-up has been unrevealing.  She does not have significant coronary disease and LV function is normal.  Previous CTA showed no pulmonary embolus.  Symptoms have improved.  2 palpitations/tachycardia-we will continue beta-blocker at present dose.  Symptoms are reasonly well controlled.  3 hyperlipidemia-continue statin.  4 history of right middle lobe small nodule-patient is considered low risk as she has never smoked and has had no primary malignancies.  No follow-up was recommended.  5 minimal coronary artery disease-noted on previous CTA.  Continue aspirin and statin.  Kirk Ruths, MD

## 2021-06-11 ENCOUNTER — Ambulatory Visit (INDEPENDENT_AMBULATORY_CARE_PROVIDER_SITE_OTHER): Payer: BC Managed Care – PPO | Admitting: Cardiology

## 2021-06-11 ENCOUNTER — Encounter: Payer: Self-pay | Admitting: Cardiology

## 2021-06-11 ENCOUNTER — Other Ambulatory Visit: Payer: Self-pay

## 2021-06-11 VITALS — BP 124/82 | HR 57 | Ht 64.0 in | Wt 188.4 lb

## 2021-06-11 DIAGNOSIS — R0602 Shortness of breath: Secondary | ICD-10-CM | POA: Diagnosis not present

## 2021-06-11 DIAGNOSIS — E7800 Pure hypercholesterolemia, unspecified: Secondary | ICD-10-CM | POA: Diagnosis not present

## 2021-06-11 DIAGNOSIS — R002 Palpitations: Secondary | ICD-10-CM | POA: Diagnosis not present

## 2021-06-11 DIAGNOSIS — I10 Essential (primary) hypertension: Secondary | ICD-10-CM | POA: Diagnosis not present

## 2021-06-11 NOTE — Patient Instructions (Signed)

## 2021-06-27 DIAGNOSIS — R059 Cough, unspecified: Secondary | ICD-10-CM | POA: Diagnosis not present

## 2021-06-27 DIAGNOSIS — R52 Pain, unspecified: Secondary | ICD-10-CM | POA: Diagnosis not present

## 2021-06-27 DIAGNOSIS — R509 Fever, unspecified: Secondary | ICD-10-CM | POA: Diagnosis not present

## 2021-06-30 DIAGNOSIS — R509 Fever, unspecified: Secondary | ICD-10-CM | POA: Diagnosis not present

## 2021-06-30 DIAGNOSIS — R0989 Other specified symptoms and signs involving the circulatory and respiratory systems: Secondary | ICD-10-CM | POA: Diagnosis not present

## 2021-06-30 DIAGNOSIS — R059 Cough, unspecified: Secondary | ICD-10-CM | POA: Diagnosis not present

## 2021-06-30 DIAGNOSIS — Z20822 Contact with and (suspected) exposure to covid-19: Secondary | ICD-10-CM | POA: Diagnosis not present

## 2021-07-11 DIAGNOSIS — J209 Acute bronchitis, unspecified: Secondary | ICD-10-CM | POA: Diagnosis not present

## 2021-07-14 DIAGNOSIS — R059 Cough, unspecified: Secondary | ICD-10-CM | POA: Diagnosis not present

## 2021-08-19 DIAGNOSIS — L570 Actinic keratosis: Secondary | ICD-10-CM | POA: Diagnosis not present

## 2021-08-19 DIAGNOSIS — L821 Other seborrheic keratosis: Secondary | ICD-10-CM | POA: Diagnosis not present

## 2021-08-19 DIAGNOSIS — C44519 Basal cell carcinoma of skin of other part of trunk: Secondary | ICD-10-CM | POA: Diagnosis not present

## 2021-08-19 DIAGNOSIS — L578 Other skin changes due to chronic exposure to nonionizing radiation: Secondary | ICD-10-CM | POA: Diagnosis not present

## 2021-08-19 DIAGNOSIS — D225 Melanocytic nevi of trunk: Secondary | ICD-10-CM | POA: Diagnosis not present

## 2021-08-19 DIAGNOSIS — D485 Neoplasm of uncertain behavior of skin: Secondary | ICD-10-CM | POA: Diagnosis not present

## 2021-09-22 DIAGNOSIS — Z6832 Body mass index (BMI) 32.0-32.9, adult: Secondary | ICD-10-CM | POA: Diagnosis not present

## 2021-09-22 DIAGNOSIS — Z124 Encounter for screening for malignant neoplasm of cervix: Secondary | ICD-10-CM | POA: Diagnosis not present

## 2021-09-22 DIAGNOSIS — Z1231 Encounter for screening mammogram for malignant neoplasm of breast: Secondary | ICD-10-CM | POA: Diagnosis not present

## 2021-09-22 DIAGNOSIS — R87615 Unsatisfactory cytologic smear of cervix: Secondary | ICD-10-CM | POA: Diagnosis not present

## 2021-09-22 DIAGNOSIS — Z1151 Encounter for screening for human papillomavirus (HPV): Secondary | ICD-10-CM | POA: Diagnosis not present

## 2021-09-22 DIAGNOSIS — Z01419 Encounter for gynecological examination (general) (routine) without abnormal findings: Secondary | ICD-10-CM | POA: Diagnosis not present

## 2021-10-03 DIAGNOSIS — C44519 Basal cell carcinoma of skin of other part of trunk: Secondary | ICD-10-CM | POA: Diagnosis not present

## 2021-10-13 DIAGNOSIS — Z1151 Encounter for screening for human papillomavirus (HPV): Secondary | ICD-10-CM | POA: Diagnosis not present

## 2021-10-13 DIAGNOSIS — Z124 Encounter for screening for malignant neoplasm of cervix: Secondary | ICD-10-CM | POA: Diagnosis not present

## 2022-02-23 ENCOUNTER — Other Ambulatory Visit: Payer: Self-pay | Admitting: Cardiology

## 2022-03-26 DIAGNOSIS — G5702 Lesion of sciatic nerve, left lower limb: Secondary | ICD-10-CM | POA: Diagnosis not present

## 2022-04-07 DIAGNOSIS — D225 Melanocytic nevi of trunk: Secondary | ICD-10-CM | POA: Diagnosis not present

## 2022-04-07 DIAGNOSIS — L821 Other seborrheic keratosis: Secondary | ICD-10-CM | POA: Diagnosis not present

## 2022-04-07 DIAGNOSIS — L578 Other skin changes due to chronic exposure to nonionizing radiation: Secondary | ICD-10-CM | POA: Diagnosis not present

## 2022-04-07 DIAGNOSIS — L910 Hypertrophic scar: Secondary | ICD-10-CM | POA: Diagnosis not present

## 2022-04-07 DIAGNOSIS — M5416 Radiculopathy, lumbar region: Secondary | ICD-10-CM | POA: Diagnosis not present

## 2022-04-07 DIAGNOSIS — D485 Neoplasm of uncertain behavior of skin: Secondary | ICD-10-CM | POA: Diagnosis not present

## 2022-04-07 DIAGNOSIS — L570 Actinic keratosis: Secondary | ICD-10-CM | POA: Diagnosis not present

## 2022-04-14 DIAGNOSIS — M545 Low back pain, unspecified: Secondary | ICD-10-CM | POA: Diagnosis not present

## 2022-04-17 DIAGNOSIS — M5126 Other intervertebral disc displacement, lumbar region: Secondary | ICD-10-CM | POA: Diagnosis not present

## 2022-04-17 DIAGNOSIS — M5416 Radiculopathy, lumbar region: Secondary | ICD-10-CM | POA: Diagnosis not present

## 2022-04-20 DIAGNOSIS — C44319 Basal cell carcinoma of skin of other parts of face: Secondary | ICD-10-CM | POA: Diagnosis not present

## 2022-04-23 DIAGNOSIS — M5416 Radiculopathy, lumbar region: Secondary | ICD-10-CM | POA: Diagnosis not present

## 2022-05-04 DIAGNOSIS — M5416 Radiculopathy, lumbar region: Secondary | ICD-10-CM | POA: Diagnosis not present

## 2022-05-04 DIAGNOSIS — S3421XD Injury of nerve root of lumbar spine, subsequent encounter: Secondary | ICD-10-CM | POA: Diagnosis not present

## 2022-05-04 DIAGNOSIS — M545 Low back pain, unspecified: Secondary | ICD-10-CM | POA: Diagnosis not present

## 2022-05-06 DIAGNOSIS — C44319 Basal cell carcinoma of skin of other parts of face: Secondary | ICD-10-CM | POA: Diagnosis not present

## 2022-05-06 DIAGNOSIS — Z85828 Personal history of other malignant neoplasm of skin: Secondary | ICD-10-CM | POA: Diagnosis not present

## 2022-05-06 DIAGNOSIS — Z5189 Encounter for other specified aftercare: Secondary | ICD-10-CM | POA: Diagnosis not present

## 2022-05-11 DIAGNOSIS — M5416 Radiculopathy, lumbar region: Secondary | ICD-10-CM | POA: Diagnosis not present

## 2022-05-11 DIAGNOSIS — M545 Low back pain, unspecified: Secondary | ICD-10-CM | POA: Diagnosis not present

## 2022-05-11 DIAGNOSIS — S3421XD Injury of nerve root of lumbar spine, subsequent encounter: Secondary | ICD-10-CM | POA: Diagnosis not present

## 2022-05-18 DIAGNOSIS — S3421XD Injury of nerve root of lumbar spine, subsequent encounter: Secondary | ICD-10-CM | POA: Diagnosis not present

## 2022-05-18 DIAGNOSIS — M545 Low back pain, unspecified: Secondary | ICD-10-CM | POA: Diagnosis not present

## 2022-05-18 DIAGNOSIS — M5416 Radiculopathy, lumbar region: Secondary | ICD-10-CM | POA: Diagnosis not present

## 2022-05-25 DIAGNOSIS — M5416 Radiculopathy, lumbar region: Secondary | ICD-10-CM | POA: Diagnosis not present

## 2022-05-26 DIAGNOSIS — R051 Acute cough: Secondary | ICD-10-CM | POA: Diagnosis not present

## 2022-05-26 DIAGNOSIS — Z111 Encounter for screening for respiratory tuberculosis: Secondary | ICD-10-CM | POA: Diagnosis not present

## 2022-06-01 DIAGNOSIS — M5416 Radiculopathy, lumbar region: Secondary | ICD-10-CM | POA: Diagnosis not present

## 2022-06-01 DIAGNOSIS — S3421XD Injury of nerve root of lumbar spine, subsequent encounter: Secondary | ICD-10-CM | POA: Diagnosis not present

## 2022-06-01 DIAGNOSIS — M545 Low back pain, unspecified: Secondary | ICD-10-CM | POA: Diagnosis not present

## 2022-06-08 DIAGNOSIS — M545 Low back pain, unspecified: Secondary | ICD-10-CM | POA: Diagnosis not present

## 2022-06-08 DIAGNOSIS — S3421XD Injury of nerve root of lumbar spine, subsequent encounter: Secondary | ICD-10-CM | POA: Diagnosis not present

## 2022-06-08 DIAGNOSIS — M5416 Radiculopathy, lumbar region: Secondary | ICD-10-CM | POA: Diagnosis not present

## 2022-06-15 DIAGNOSIS — M545 Low back pain, unspecified: Secondary | ICD-10-CM | POA: Diagnosis not present

## 2022-06-15 DIAGNOSIS — S3421XD Injury of nerve root of lumbar spine, subsequent encounter: Secondary | ICD-10-CM | POA: Diagnosis not present

## 2022-06-15 DIAGNOSIS — M5416 Radiculopathy, lumbar region: Secondary | ICD-10-CM | POA: Diagnosis not present

## 2022-06-22 DIAGNOSIS — M545 Low back pain, unspecified: Secondary | ICD-10-CM | POA: Diagnosis not present

## 2022-06-22 DIAGNOSIS — M5416 Radiculopathy, lumbar region: Secondary | ICD-10-CM | POA: Diagnosis not present

## 2022-06-22 DIAGNOSIS — S3421XD Injury of nerve root of lumbar spine, subsequent encounter: Secondary | ICD-10-CM | POA: Diagnosis not present

## 2022-06-29 ENCOUNTER — Ambulatory Visit (INDEPENDENT_AMBULATORY_CARE_PROVIDER_SITE_OTHER): Payer: BC Managed Care – PPO | Admitting: Pulmonary Disease

## 2022-06-29 ENCOUNTER — Encounter: Payer: Self-pay | Admitting: Pulmonary Disease

## 2022-06-29 ENCOUNTER — Ambulatory Visit (INDEPENDENT_AMBULATORY_CARE_PROVIDER_SITE_OTHER): Payer: BC Managed Care – PPO

## 2022-06-29 ENCOUNTER — Telehealth: Payer: Self-pay | Admitting: Pulmonary Disease

## 2022-06-29 VITALS — BP 124/82 | HR 79 | Ht 65.0 in | Wt 201.2 lb

## 2022-06-29 DIAGNOSIS — R053 Chronic cough: Secondary | ICD-10-CM

## 2022-06-29 DIAGNOSIS — J309 Allergic rhinitis, unspecified: Secondary | ICD-10-CM

## 2022-06-29 DIAGNOSIS — R059 Cough, unspecified: Secondary | ICD-10-CM | POA: Diagnosis not present

## 2022-06-29 DIAGNOSIS — G4719 Other hypersomnia: Secondary | ICD-10-CM

## 2022-06-29 DIAGNOSIS — R918 Other nonspecific abnormal finding of lung field: Secondary | ICD-10-CM | POA: Diagnosis not present

## 2022-06-29 DIAGNOSIS — S3421XD Injury of nerve root of lumbar spine, subsequent encounter: Secondary | ICD-10-CM | POA: Diagnosis not present

## 2022-06-29 DIAGNOSIS — M5416 Radiculopathy, lumbar region: Secondary | ICD-10-CM | POA: Diagnosis not present

## 2022-06-29 DIAGNOSIS — I10 Essential (primary) hypertension: Secondary | ICD-10-CM | POA: Diagnosis not present

## 2022-06-29 DIAGNOSIS — J45991 Cough variant asthma: Secondary | ICD-10-CM | POA: Diagnosis not present

## 2022-06-29 DIAGNOSIS — M545 Low back pain, unspecified: Secondary | ICD-10-CM | POA: Diagnosis not present

## 2022-06-29 LAB — NITRIC OXIDE: Nitric Oxide: 21

## 2022-06-29 MED ORDER — PULMICORT FLEXHALER 90 MCG/ACT IN AEPB: 2.0000 | INHALATION_SPRAY | Freq: Two times a day (BID) | RESPIRATORY_TRACT | 5 refills | Status: AC

## 2022-06-29 MED ORDER — AMOXICILLIN-POT CLAVULANATE 875-125 MG PO TABS: 1.0000 | ORAL_TABLET | Freq: Two times a day (BID) | ORAL | 0 refills | Status: DC

## 2022-06-29 NOTE — Progress Notes (Signed)
Synopsis: Referred in December 2023 for chronic cough  Subjective:   PATIENT ID: Joanne Brewer GENDER: female DOB: 1961/01/09, MRN: [id number]   HPI  Chief Complaint  Patient presents with  . Consult    Referred by PCP for a cough that started 1-2 weeks before Thanksgiving. States the cough was productive at times, described as yellow phlegm.     Joanne Brewer is here to see me for cough. > it's been going on for a year and a half > it may go a way for a few weeks, but then come back > when she takes steroids it will get better, but this time it didn't work > two antibiotics recently didn't help > she was producing some phlegm > she'll have occasional morning fevers to 100.2 in the last few week > she's had the elevated temp in the last few weeks > she says that this cough started when she had COVID > she's tested positive for flu in 2023, tested positive for RSV in December 2022 > cough is worse at night > denies heartburn or indigestion > no color to the mucus  No flonase or saline sprays She uses Zyrtec or Claritin at night  Her daughter was diagnosed with latent TB.    She is taking omeprazole, didn't help with cough  She never smoked  She was treated for asthma in the past > when she was a teenager she was diagnosed and treated with albuterol > she was given Advair a long time ago when she was a younger adult  > some nighttime symptoms of breathlessness and cough  She has daytime fatigue which is unusual for her.  She has a lot of trouble sleeping. She snores at night.  He says that she'll quit breathing.  She doesn't really feel well rested.  No morning headache.  Never tested for sleep apnea.    Has mild dyspnea on exertion  She had post partum cardiomyopathy.  She had it three times and has been followed by cardiology and they said that everything returrned to normal.  Record review: Notes from [facility] family physicians reviewed where the patient was seen on  May 26, 2022 in the context of a chronic cough.  She noted some fullness in her ears and some sinus congestion.  In the past when she has had this she has been treated with steroids which is effective.  She notes a contact with her daughter who had recently been diagnosed with latent tuberculosis.  Past Medical History:  Diagnosis Date  . Cancer (HCC)    basil cell  . Cardiomegaly 1991   POSTPARTUM - after vaginal delivery  . CHF (congestive heart failure) ([place]) 1992   POSTPARTUM- after vaginal delivery  . Fibroids   . Hyperlipidemia   . Hypertension    POSTPARTUM  . Osteopenia      Family History  Problem Relation Age of Onset  . Hypertension Mother   . CVA Mother   . AAA (abdominal aortic aneurysm) Mother   . Hypertension Father   . CVA Father   . Colon cancer Neg Hx   . Esophageal cancer Neg Hx   . Stomach cancer Neg Hx   . Colon polyps Neg Hx   . Rectal cancer Neg Hx      Social History   Socioeconomic History  . Marital status: Married    Spouse name: Not on file  . Number of children: Not on file  . Years of education: Not on  file  . Highest education level: Not on file  Occupational History  . Not on file  Tobacco Use  . Smoking status: Never  . Smokeless tobacco: Never  Vaping Use  . Vaping Use: Never used  Substance and Sexual Activity  . Alcohol use: Yes    Comment: wine1-2/month  . Drug use: No  . Sexual activity: Not on file  Other Topics Concern  . Not on file  Social History Narrative  . Not on file   Social Determinants of Health   Financial Resource Strain: Not on file  Food Insecurity: Not on file  Transportation Needs: Not on file  Physical Activity: Not on file  Stress: Not on file  Social Connections: Not on file  Intimate Partner Violence: Not on file     Allergies  Allergen Reactions  . Ciprofloxacin Other (See Comments)    Pt states she passed out      Outpatient Medications Prior to Visit  Medication Sig Dispense  Refill  . acetaminophen (TYLENOL) 500 MG tablet Take 1,000 mg by mouth as needed (pain).    Marland Kitchen aspirin 81 MG EC tablet Take 81 mg by mouth daily.    Marland Kitchen atorvastatin (LIPITOR) 10 MG tablet Take 10 mg by mouth at bedtime.    Marland Kitchen CALCIUM PO Take 1 tablet by mouth daily.    . cetirizine (ZYRTEC) 10 MG chewable tablet Chew 10 mg by mouth at bedtime.    Mariane Baumgarten Sodium (DSS) 100 MG CAPS Take 300 mg by mouth at bedtime.    Marland Kitchen losartan-hydrochlorothiazide (HYZAAR) 100-12.5 MG tablet Take 0.5 tablets by mouth daily.    . magnesium gluconate (MAGONATE) 500 MG tablet Take 500 mg by mouth daily.    . metoprolol succinate (TOPROL-XL) 25 MG 24 hr tablet TAKE 1 TABLET (25 MG TOTAL) BY MOUTH DAILY. 90 tablet 3  . Cholecalciferol (VITAMIN D-3 PO) Take 2 tablets by mouth daily.    . eszopiclone (LUNESTA) 1 MG TABS tablet Take 1 mg by mouth at bedtime as needed for sleep.    . metoprolol tartrate (LOPRESSOR) 100 MG tablet TAKE 2 HOURS PRIOR TO CT SCAN 1 tablet 0   No facility-administered medications prior to visit.    Review of Systems  Constitutional:  Negative for chills, fever, malaise/fatigue and weight loss.  HENT:  Negative for congestion, nosebleeds, sinus pain and sore throat.   Eyes:  Negative for photophobia, pain and discharge.  Respiratory:  Positive for cough and shortness of breath. Negative for hemoptysis, sputum production and wheezing.   Cardiovascular:  Negative for chest pain, palpitations, orthopnea and leg swelling.  Gastrointestinal:  Negative for abdominal pain, constipation, diarrhea, nausea and vomiting.  Genitourinary:  Negative for dysuria, frequency, hematuria and urgency.  Musculoskeletal:  Negative for back pain, joint pain, myalgias and neck pain.  Skin:  Negative for itching and rash.  Neurological:  Negative for tingling, tremors, sensory change, speech change, focal weakness, seizures, weakness and headaches.  Psychiatric/Behavioral:  Negative for memory loss, substance abuse  and suicidal ideas. The patient is not nervous/anxious.       Objective:  Physical Exam   Vitals:   06/29/22 0928  Weight: 201 lb 3.2 oz (91.3 kg)  Height: '5\' 5"'$  (1.651 m)    Gen: well appearing, no acute distress HENT: NCAT, OP clear, neck supple without masses Eyes: PERRL, EOMi Lymph: no cervical lymphadenopathy PULM: CTA B CV: RRR, no mgr, no JVD GI: BS+, soft, nontender, no hsm Derm: no rash  or skin breakdown MSK: normal bulk and tone Neuro: A&Ox4,  MAEW Psyche: normal mood and affect   CBC    Component Value Date/Time   WBC 6.6 01/17/2021 1201   RBC 4.79 01/17/2021 1201   HGB 13.9 01/17/2021 1201   HCT 42.1 01/17/2021 1201   PLT 244 01/17/2021 1201   MCV 87.9 01/17/2021 1201   MCH 29.0 01/17/2021 1201   MCHC 33.0 01/17/2021 1201   RDW 12.6 01/17/2021 1201   LYMPHSABS 1.7 01/17/2021 1201   MONOABS 0.6 01/17/2021 1201   EOSABS 0.1 01/17/2021 1201   BASOSABS 0.0 01/17/2021 1201     Chest imaging: July 2022 CT angiogram chest images independently reviewed showing hiatal hernia, normal pulmonary parenchyma with the exception of a calcified left lower lobe granuloma, tiny right middle lobe pulmonary nodules 4 mm in size and 2 mm in size, no pulmonary embolism  PFT: 06/2022 FENO: 21 PPB  Labs:  Path:  Echo:  Heart Catheterization:       Assessment & Plan:   No diagnosis found.  Discussion: Katrinna Travieso presented for evaluation of chronic cough which is worse at night and with an underlying diagnosis of asthma which has persisted for most of her adult life.  In her particular case I think postnasal drip and asthma could both be contributing to her cough.  She has had repeated viral infections which are also likely contributing as well.  She also has daytime fatigue, trouble sleeping and snoring.  I worry about sleep apnea.  Exhaled nitric oxide testing was equivocal today.  Plan: Chronic cough CXR today FENO today Take omeprazole 40 mg  daily as you are doing Avoid fatty foods, alcohol, chocolate, caffeine and tobacco products Elevated the head of your bed 30 degrees No eating within 3 hours of bedtime You need to try to suppress your cough to allow your larynx (voice box) to heal.  For three days don't talk, laugh, sing, or clear your throat. Do everything you can to suppress the cough during this time. Use hard candies (sugarless Jolly Ranchers) or non-mint or non-menthol containing cough drops during this time to soothe your throat.  Use a cough suppressant (Delsym or what I have prescribed you) around the clock during this time.  After three days, gradually increase the use of your voice and back off on the cough suppressants.  Mild persistent asthma: Start pulmicort 25mg 2 puff bid Use albuterol as needed for dyspnea PFT next visit  Allergic rhinitis: Use Neil Med rinses with distilled water at least twice per day using the instructions on the package. 1/2 hour after using the NMilta DeitersMed rinse, use Nasacort (or Flonase) two puffs in each nostril once per day.  Remember that the Nasacort can take 1-2 weeks to work after regular use. Use generic zyrtec (cetirizine) every day.  If this doesn't help, then stop taking it and use chlorpheniramine-phenylephrine combination tablets.  Daytime fatigue and snoring Home sleep study No driving while sleepy  We will see you back in 3-4 weeks or sooner if needed  Immunizations:  There is no immunization history on file for this patient.   Current Outpatient Medications:  .  acetaminophen (TYLENOL) 500 MG tablet, Take 1,000 mg by mouth as needed (pain)., Disp: , Rfl:  .  aspirin 81 MG EC tablet, Take 81 mg by mouth daily., Disp: , Rfl:  .  atorvastatin (LIPITOR) 10 MG tablet, Take 10 mg by mouth at bedtime., Disp: , Rfl:  .  CALCIUM  PO, Take 1 tablet by mouth daily., Disp: , Rfl:  .  cetirizine (ZYRTEC) 10 MG chewable tablet, Chew 10 mg by mouth at bedtime., Disp: , Rfl:  .   Docusate Sodium (DSS) 100 MG CAPS, Take 300 mg by mouth at bedtime., Disp: , Rfl:  .  losartan-hydrochlorothiazide (HYZAAR) 100-12.5 MG tablet, Take 0.5 tablets by mouth daily., Disp: , Rfl:  .  magnesium gluconate (MAGONATE) 500 MG tablet, Take 500 mg by mouth daily., Disp: , Rfl:  .  metoprolol succinate (TOPROL-XL) 25 MG 24 hr tablet, TAKE 1 TABLET (25 MG TOTAL) BY MOUTH DAILY., Disp: 90 tablet, Rfl: 3

## 2022-06-29 NOTE — Patient Instructions (Addendum)
Chronic cough CXR today FENO today Take omeprazole 40 mg daily as you are doing Avoid fatty foods, alcohol, chocolate, caffeine and tobacco products Elevated the head of your bed 30 degrees No eating within 3 hours of bedtime You need to try to suppress your cough to allow your larynx (voice box) to heal.  For three days don't talk, laugh, sing, or clear your throat. Do everything you can to suppress the cough during this time. Use hard candies (sugarless Jolly Ranchers) or non-mint or non-menthol containing cough drops during this time to soothe your throat.  Use a cough suppressant (Delsym or what I have prescribed you) around the clock during this time.  After three days, gradually increase the use of your voice and back off on the cough suppressants.  Mild persistent asthma: Start pulmicort 76mg 2 puff bid Use albuterol as needed for dyspnea PFT next visit  Allergic rhinitis: Use Neil Med rinses with distilled water at least twice per day using the instructions on the package. 1/2 hour after using the NMilta DeitersMed rinse, use Nasacort (or Flonase) two puffs in each nostril once per day.  Remember that the Nasacort can take 1-2 weeks to work after regular use. Use generic zyrtec (cetirizine) every day.  If this doesn't help, then stop taking it and use chlorpheniramine-phenylephrine combination tablets.  Daytime fatigue and snoring Home sleep study No driving while sleepy  We will see you back in 3-4 weeks or sooner if needed

## 2022-06-29 NOTE — Telephone Encounter (Signed)
I called Joanne Brewer to let her know that her chest x-ray showed what appeared to be early developing pneumonia in the right lower lobe.  I want her to take Augmentin twice a day for a week, I have called this in.  I would like for her to come back and see [place] in 1 week to get a repeat chest x-ray and see a nurse practitioner.

## 2022-07-01 ENCOUNTER — Telehealth: Payer: Self-pay | Admitting: Pulmonary Disease

## 2022-07-01 DIAGNOSIS — R053 Chronic cough: Secondary | ICD-10-CM

## 2022-07-01 NOTE — Telephone Encounter (Signed)
I checked the schedules for you as well as the NPs. The soonest appointment would be January 4th. Would she be ok to wait until then or would you like for her to come in next for the CXR only?

## 2022-07-01 NOTE — Telephone Encounter (Signed)
Dr Pennie Banter,  I looked at your last AVS and I don't see anything about a 1 week follow up chest xray.   Are you wanting to see her for a new chest xray next week. She was in office 06/29/2022  Will call patient back with update on chest xray from Dr Lake Bells  Please advise sir

## 2022-07-02 NOTE — Telephone Encounter (Signed)
Called and spoke to patient and got her set up for office visit with Dr Lake Bells for 2 week follow up and chest xray before appt. Chest xray is ordered as noted from [place]. Nothing further needed  07/16/2021 9:45am McQu[place]

## 2022-07-14 DIAGNOSIS — M545 Low back pain, unspecified: Secondary | ICD-10-CM | POA: Diagnosis not present

## 2022-07-14 DIAGNOSIS — S3421XD Injury of nerve root of lumbar spine, subsequent encounter: Secondary | ICD-10-CM | POA: Diagnosis not present

## 2022-07-14 DIAGNOSIS — M5416 Radiculopathy, lumbar region: Secondary | ICD-10-CM | POA: Diagnosis not present

## 2022-07-16 ENCOUNTER — Ambulatory Visit (INDEPENDENT_AMBULATORY_CARE_PROVIDER_SITE_OTHER): Payer: BC Managed Care – PPO | Admitting: Pulmonary Disease

## 2022-07-16 ENCOUNTER — Encounter: Payer: Self-pay | Admitting: Pulmonary Disease

## 2022-07-16 ENCOUNTER — Ambulatory Visit (INDEPENDENT_AMBULATORY_CARE_PROVIDER_SITE_OTHER): Payer: BC Managed Care – PPO

## 2022-07-16 VITALS — BP 110/78 | HR 87 | Temp 98.5°F | Ht 64.0 in | Wt 204.0 lb

## 2022-07-16 DIAGNOSIS — J189 Pneumonia, unspecified organism: Secondary | ICD-10-CM

## 2022-07-16 DIAGNOSIS — J45991 Cough variant asthma: Secondary | ICD-10-CM

## 2022-07-16 DIAGNOSIS — R053 Chronic cough: Secondary | ICD-10-CM | POA: Diagnosis not present

## 2022-07-16 DIAGNOSIS — J309 Allergic rhinitis, unspecified: Secondary | ICD-10-CM | POA: Diagnosis not present

## 2022-07-16 NOTE — Progress Notes (Signed)
Synopsis: Referred in December 2023 for chronic cough  Subjective:   PATIENT ID: Joanne Brewer GENDER: female DOB: [birth year], MRN: [id number]   HPI  Chief Complaint  Patient presents with   Follow-up    No new issues since LOV. Results from chest xray today     [name] is here to follow-up from an incidentally discovered pneumonia in the right lower lobe on the last visit.  She took the Augmentin and she says she is now starting to feel quite a bit better.  Cough is improving.  Still has some postnasal drip and sinus congestion.  Does not feel that she needs to produce mucus Cough is better. Took the augmentin, now off Still taking  Past Medical History:  Diagnosis Date   Cancer ([place])    basil cell   Cardiomegaly 1991   POSTPARTUM - after vaginal delivery   CHF (congestive heart failure) ([place]) 1992   POSTPARTUM- after vaginal delivery   Fibroids    Hyperlipidemia    Hypertension    POSTPARTUM   Osteopenia      Review of Systems  Constitutional:  Negative for chills, fever, malaise/fatigue and weight loss.  HENT:  Positive for congestion. Negative for sinus pain and sore throat.   Respiratory:  Positive for cough. Negative for sputum production and shortness of breath.   Cardiovascular:  Negative for chest pain and leg swelling.      Objective:  Physical Exam   Vitals:   07/16/22 1007  BP: 110/78  Pulse: 87  Temp: 98.5 F (36.9 C)  TempSrc: Oral  SpO2: 98%  Weight: 204 lb (92.5 kg)  Height: 5' 4" (1.626 m)    Gen: well appearing, frequent cough HENT: OP clear, neck supple PULM: CTA B, normal effort  CV: RRR, no mgr GI: BS+, soft, nontender Derm: no cyanosis or rash Psyche: normal mood and affect    CBC    Component Value Date/Time   WBC 6.6 01/17/2021 1201   RBC 4.79 01/17/2021 1201   HGB 13.9 01/17/2021 1201   HCT 42.1 01/17/2021 1201   PLT 244 01/17/2021 1201   MCV 87.9 01/17/2021 1201   MCH 29.0 01/17/2021 1201   MCHC 33.0 01/17/2021  1201   RDW 12.6 01/17/2021 1201   LYMPHSABS 1.7 01/17/2021 1201   MONOABS 0.6 01/17/2021 1201   EOSABS 0.1 01/17/2021 1201   BASOSABS 0.0 01/17/2021 1201     Chest imaging: July 2022 CT angiogram chest images independently reviewed showing hiatal hernia, normal pulmonary parenchyma with the exception of a calcified left lower lobe granuloma, tiny right middle lobe pulmonary nodules 4 mm in size and 2 mm in size, no pulmonary embolism  PFT: 06/2022 FENO: 21 PPB  Labs:  Path:  Echo:  Heart Catheterization:       Assessment & Plan:   Chronic cough - Plan: DG Chest 2 View  Cough variant asthma  Allergic rhinitis, unspecified seasonality, unspecified trigger  Pneumonia of right lower lobe due to infectious organism  Discussion: 62 year old female with chronic cough likely related to postnasal drip, gastroesophageal reflux disease and possibly asthma incidentally found to have right lower lobe pneumonia.  Doing well after taking Augmentin but still has cough likely due to all the above.  Plan: Chronic cough: Continue gastroesophageal reflux disease treatment, postnasal drip treatment and the Pulmicort prescribed on the last visit  Possible asthma: PFT next visit  Right lower lobe pneumonia: You seem to be improving, remember that the cough and trouble from  this can be slow to resolve Will get a repeat chest x-ray in 6 weeks  Gastroesophageal reflux disease: Omeprazole  Allergic rhinitis: Flonase, Zyrtec  We will see you back in 6 weeks or sooner if needed.  Immunizations: Immunization History  Administered Date(s) Administered   MMR 08/08/2005   Tdap 09/02/2011   Unspecified SARS-COV-2 Vaccination 06/12/2020, 07/16/2020   Zoster Recombinat (Shingrix) 01/20/2018, 04/14/2018     Current Outpatient Medications:    acetaminophen (TYLENOL) 500 MG tablet, Take 1,000 mg by mouth as needed (pain)., Disp: , Rfl:    albuterol (VENTOLIN HFA) 108 (90 Base) MCG/ACT  inhaler, Inhale 1-2 puffs into the lungs every 6 (six) hours as needed., Disp: , Rfl:    aspirin 81 MG EC tablet, Take 81 mg by mouth daily., Disp: , Rfl:    atorvastatin (LIPITOR) 10 MG tablet, Take 10 mg by mouth at bedtime., Disp: , Rfl:    Budesonide (PULMICORT FLEXHALER) 90 MCG/ACT inhaler, Inhale 2 puffs into the lungs 2 (two) times daily., Disp: 1 each, Rfl: 5   CALCIUM PO, Take 1 tablet by mouth daily., Disp: , Rfl:    cetirizine (ZYRTEC) 10 MG chewable tablet, Chew 10 mg by mouth at bedtime., Disp: , Rfl:    Docusate Sodium (DSS) 100 MG CAPS, Take 300 mg by mouth at bedtime., Disp: , Rfl:    losartan-hydrochlorothiazide (HYZAAR) 100-12.5 MG tablet, Take 0.5 tablets by mouth daily., Disp: , Rfl:    magnesium gluconate (MAGONATE) 500 MG tablet, Take 500 mg by mouth daily., Disp: , Rfl:    metoprolol succinate (TOPROL-XL) 25 MG 24 hr tablet, TAKE 1 TABLET (25 MG TOTAL) BY MOUTH DAILY., Disp: 90 tablet, Rfl: 3   omeprazole (PRILOSEC) 40 MG capsule, Take 40 mg by mouth every morning., Disp: , Rfl:

## 2022-07-16 NOTE — Patient Instructions (Signed)
Chronic cough: Continue gastroesophageal reflux disease treatment, postnasal drip treatment and the Pulmicort prescribed on the last visit  Possible asthma: PFT next visit  Right lower lobe pneumonia: You seem to be improving, remember that the cough and trouble from this can be slow to resolve Will get a repeat chest x-ray in 6 weeks  Gastroesophageal reflux disease: Omeprazole  Allergic rhinitis: Flonase, Zyrtec  We will see you back in 6 weeks or sooner if needed.

## 2022-07-17 ENCOUNTER — Encounter: Payer: Self-pay | Admitting: Pulmonary Disease

## 2022-07-21 DIAGNOSIS — M5416 Radiculopathy, lumbar region: Secondary | ICD-10-CM | POA: Diagnosis not present

## 2022-07-22 DIAGNOSIS — E785 Hyperlipidemia, unspecified: Secondary | ICD-10-CM | POA: Diagnosis not present

## 2022-07-22 DIAGNOSIS — R0602 Shortness of breath: Secondary | ICD-10-CM | POA: Diagnosis not present

## 2022-07-24 DIAGNOSIS — R053 Chronic cough: Secondary | ICD-10-CM | POA: Diagnosis not present

## 2022-07-24 DIAGNOSIS — I10 Essential (primary) hypertension: Secondary | ICD-10-CM | POA: Diagnosis not present

## 2022-07-24 DIAGNOSIS — Z8759 Personal history of other complications of pregnancy, childbirth and the puerperium: Secondary | ICD-10-CM | POA: Diagnosis not present

## 2022-07-28 ENCOUNTER — Encounter: Payer: Self-pay | Admitting: Gastroenterology

## 2022-07-28 DIAGNOSIS — I429 Cardiomyopathy, unspecified: Secondary | ICD-10-CM | POA: Diagnosis not present

## 2022-08-03 ENCOUNTER — Ambulatory Visit: Payer: BC Managed Care – PPO

## 2022-08-03 DIAGNOSIS — Z0000 Encounter for general adult medical examination without abnormal findings: Secondary | ICD-10-CM | POA: Diagnosis not present

## 2022-08-03 DIAGNOSIS — E7800 Pure hypercholesterolemia, unspecified: Secondary | ICD-10-CM | POA: Diagnosis not present

## 2022-08-03 DIAGNOSIS — M8588 Other specified disorders of bone density and structure, other site: Secondary | ICD-10-CM | POA: Diagnosis not present

## 2022-08-03 DIAGNOSIS — I10 Essential (primary) hypertension: Secondary | ICD-10-CM | POA: Diagnosis not present

## 2022-08-03 DIAGNOSIS — G4733 Obstructive sleep apnea (adult) (pediatric): Secondary | ICD-10-CM

## 2022-08-03 DIAGNOSIS — G4719 Other hypersomnia: Secondary | ICD-10-CM

## 2022-08-03 DIAGNOSIS — J4530 Mild persistent asthma, uncomplicated: Secondary | ICD-10-CM | POA: Diagnosis not present

## 2022-08-14 ENCOUNTER — Telehealth: Payer: Self-pay | Admitting: Pulmonary Disease

## 2022-08-14 DIAGNOSIS — G4733 Obstructive sleep apnea (adult) (pediatric): Secondary | ICD-10-CM

## 2022-08-14 NOTE — Telephone Encounter (Signed)
Call patient  Sleep study result  Date of study: 08/03/2022  Impression: Severe obstructive sleep apnea Mild oxygen desaturations  Recommendation: DME referral  Recommend CPAP therapy for severe obstructive sleep apnea  Auto titrating CPAP with pressure settings of 5-20 will be appropriate  Encourage weight loss measures  Follow-up in the office 4 to 6 weeks following initiation of treatment

## 2022-08-17 NOTE — Telephone Encounter (Signed)
Hi Anika,   Could you call the patient and let them know that this is what we recommend from her sleep study that showed severe obstructive sleep apnea?   Impression:  Severe obstructive sleep apnea  Mild oxygen desaturations    Recommendation:  DME referral    Recommend CPAP therapy for severe obstructive sleep apnea    Auto titrating CPAP with pressure settings of 5-20 will be appropriate    Encourage weight loss measures    Follow-up in the office 4 to 6 weeks following initiation of treatment    Ruby Cola   I spoke with the pt and notified of results/recs  She was agreeable to CPAP  Order placed and rov was scheduled

## 2022-08-19 DIAGNOSIS — D235 Other benign neoplasm of skin of trunk: Secondary | ICD-10-CM | POA: Diagnosis not present

## 2022-08-19 DIAGNOSIS — D225 Melanocytic nevi of trunk: Secondary | ICD-10-CM | POA: Diagnosis not present

## 2022-08-19 DIAGNOSIS — L821 Other seborrheic keratosis: Secondary | ICD-10-CM | POA: Diagnosis not present

## 2022-08-19 DIAGNOSIS — L570 Actinic keratosis: Secondary | ICD-10-CM | POA: Diagnosis not present

## 2022-08-19 DIAGNOSIS — L578 Other skin changes due to chronic exposure to nonionizing radiation: Secondary | ICD-10-CM | POA: Diagnosis not present

## 2022-08-20 DIAGNOSIS — G4733 Obstructive sleep apnea (adult) (pediatric): Secondary | ICD-10-CM | POA: Diagnosis not present

## 2022-08-24 ENCOUNTER — Ambulatory Visit (INDEPENDENT_AMBULATORY_CARE_PROVIDER_SITE_OTHER): Payer: BC Managed Care – PPO | Admitting: Nurse Practitioner

## 2022-08-24 ENCOUNTER — Encounter: Payer: Self-pay | Admitting: Nurse Practitioner

## 2022-08-24 VITALS — BP 106/76 | HR 75 | Ht 64.0 in | Wt 195.0 lb

## 2022-08-24 DIAGNOSIS — Z86010 Personal history of colonic polyps: Secondary | ICD-10-CM | POA: Diagnosis not present

## 2022-08-24 DIAGNOSIS — K625 Hemorrhage of anus and rectum: Secondary | ICD-10-CM

## 2022-08-24 MED ORDER — HYDROCORTISONE (PERIANAL) 2.5 % EX CREA: 1.0000 | TOPICAL_CREAM | Freq: Every day | CUTANEOUS | 2 refills | Status: AC

## 2022-08-24 MED ORDER — NA SULFATE-K SULFATE-MG SULF 17.5-3.13-1.6 GM/177ML PO SOLN: 1.0000 | Freq: Once | ORAL | 0 refills | Status: AC

## 2022-08-24 NOTE — Progress Notes (Signed)
Agree with assessment plan as outlined.  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

## 2022-08-24 NOTE — Patient Instructions (Signed)
We have sent the following medications to your pharmacy for you to pick up at your convenience: Aunsol cream and Suprep kit.  You have been scheduled for a colonoscopy. Please follow written instructions given to you at your visit today.  Please pick up your prep supplies at the pharmacy within the next 1-3 days. If you use inhalers (even only as needed), please bring them with you on the day of your procedure.  ____________________________________________________  If your blood pressure at your visit was 140/90 or greater, please contact your primary care physician to follow up on this.  ____________________________________________________  If you are age 54 or older, your body mass index should be between 23-30. Your Body mass index is 33.47 kg/m. If this is out of the aforementioned range listed, please consider follow up with your Primary Care Provider.  If you are age 33 or younger, your body mass index should be between 19-25. Your Body mass index is 33.47 kg/m. If this is out of the aformentioned range listed, please consider follow up with your Primary Care Provider.   ___________________________________________________  The [place] providers would like to encourage you to use [facility] to communicate with providers for non-urgent requests or questions.  Due to long hold times on the telephone, sending your provider a message by [facility] may be a faster and more efficient way to get a response.  Please allow 48 business hours for a response.  Please remember that this is for non-urgent requests.  ____________________________________________________

## 2022-08-24 NOTE — Progress Notes (Signed)
Assessment    # 62 yo female with a Hx of colon polyps.  A small sessile serrated polyp and a small tubular adenoma removed at time of last colonoscopy January 2021.  A 3-year follow-up colonoscopy was recommended  # Occasional painless rectal bleeding with bowel movements. Suspect hemorrhoidal bleeding.    Plan   Schedule for  polyp surveillance colonoscopy. The risks and benefits of colonoscopy with possible polypectomy / biopsies were discussed and the patient agrees to proceed.  Continue stools softeners. Trial of Anusol cream PR at night for 7-10 days.  Briefly discussed internal hemorrhoid banding in the future if no response to steroid cream     HPI    Chief complaint: here to get scheduled for colonoscopy   Joanne Brewer is a 62 y.o. female known to Dr. Havery Moros with a past medical history of colon polyps, diverticulosis, palpitations/tachycardia, OSA on Cpap, HDL.  See PMH / [place] for additional details.   Joanne Brewer is here to get scheduled for her 3 year surveillance colonoscopy.  She is doing well.  She still has occasional painless rectal bleeding with bowel movements.  The bleeding is scant and may last a day or 2.  Episodes occur about every 3 months.  She does not have constipation as long as she takes 2 stool softeners a day.  Her stools are soft, she has at least 3-4 bowel movements a week.  Joanne Brewer has been undergoing workup for cough and intermittent dyspnea. She is being followed by Pulmonary and Cardiology  A chest x-ray early January showed a right lower lobe opacity, ? Infectious or aspiration?  She has been treated with steroids, antibiotics and a PPI  and symptoms have improved.  She had a cardiac evaluation at [facility] on 07/24/22.  Echo >> EF 55-60-%.  Symptoms were felt to be possibly related to undiagnosed sleep apnea.  She has since been on a CPAP  Previous GI Evaluation  Jan 2021 Surveillance colonoscopy  Two 2-6 mm cecal polyps  One diminutive hepatic flexure  polyp Hemorrhoids / skin tags found on perianal exam.   Path - Surgical [P], colon, cecum-2, and hepatic flexure, polyp (3) - TUBULAR ADENOMA WITHOUT HIGH-GRADE DYSPLASIA OR MALIGNANCY - SESSILE SERRATED POLYP(S) WITHOUT CYTOLOGIC DYSPLASIA  Imaging   CXR 07/16/22 Heterogeneous opacities right lung base may represent atelectasis or infection.   Echo 07/22/21  EF 55-60% Labs:     Latest Ref Rng & Units 01/17/2021   12:01 PM  CBC  WBC 4.0 - 10.5 K/uL 6.6   Hemoglobin 12.0 - 15.0 g/dL 13.9   Hematocrit 36.0 - 46.0 % 42.1   Platelets 150 - 400 K/uL 244        Latest Ref Rng & Units 01/17/2021   12:01 PM  Hepatic Function  Total Protein 6.5 - 8.1 g/dL 7.3   Albumin 3.5 - 5.0 g/dL 4.1   AST 15 - 41 U/L 18   ALT 0 - 44 U/L 26   Alk Phosphatase 38 - 126 U/L 77   Total Bilirubin 0.3 - 1.2 mg/dL 1.1      Past Medical History:  Diagnosis Date   Cancer ([place])    basil cell   Cardiomegaly 1991   POSTPARTUM - after vaginal delivery   CHF (congestive heart failure) ([place]) 1992   POSTPARTUM- after vaginal delivery   Fibroids    Hyperlipidemia    Hypertension    POSTPARTUM   Osteopenia     Past Surgical History:  Procedure Laterality Date   CESAREAN SECTION     2   COLONOSCOPY  05/2006   Sharlett Iles   HEMORRHOIDECTOMY WITH HEMORRHOID BANDING     POLYPECTOMY     right shoulder surgery Right    sacrocoloplasty     TUBAL LIGATION     WISDOM TOOTH EXTRACTION      Current Medications, Allergies, Family History and Social History were reviewed in [employer] record.     Current Outpatient Medications  Medication Sig Dispense Refill   acetaminophen (TYLENOL) 500 MG tablet Take 1,000 mg by mouth as needed (pain).     albuterol (VENTOLIN HFA) 108 (90 Base) MCG/ACT inhaler Inhale 1-2 puffs into the lungs every 6 (six) hours as needed.     aspirin 81 MG EC tablet Take 81 mg by mouth daily.     atorvastatin (LIPITOR) 10 MG tablet Take 10 mg by mouth at  bedtime.     Budesonide (PULMICORT FLEXHALER) 90 MCG/ACT inhaler Inhale 2 puffs into the lungs 2 (two) times daily. 1 each 5   CALCIUM PO Take 1 tablet by mouth daily.     cetirizine (ZYRTEC) 10 MG chewable tablet Chew 10 mg by mouth at bedtime.     Docusate Sodium (DSS) 100 MG CAPS Take 300 mg by mouth at bedtime.     losartan-hydrochlorothiazide (HYZAAR) 100-12.5 MG tablet Take 0.5 tablets by mouth daily.     magnesium gluconate (MAGONATE) 500 MG tablet Take 500 mg by mouth daily.     metoprolol succinate (TOPROL-XL) 25 MG 24 hr tablet TAKE 1 TABLET (25 MG TOTAL) BY MOUTH DAILY. 90 tablet 3   omeprazole (PRILOSEC) 40 MG capsule Take 40 mg by mouth every morning.     No current facility-administered medications for this visit.    Review of Systems: Positive for cough, fatigue, sleeping problems.  All other systems reviewed and negative except were noted in the HPI  Physical Exam  Wt Readings from Last 3 Encounters:  07/16/22 204 lb (92.5 kg)  06/29/22 201 lb 3.2 oz (91.3 kg)  06/11/21 188 lb 6.4 oz (85.5 kg)    BP 106/76, HR 75 Constitutional:  Pleasant, generally well appearing female in no acute distress. Psychiatric: Normal mood and affect. Behavior is normal. EENT: Pupils normal.  Conjunctivae are normal. No scleral icterus. Neck supple.  Cardiovascular: Normal rate, regular rhythm.  Pulmonary/chest: Effort normal and breath sounds normal. No wheezing, rales or rhonchi. Abdominal: Soft, nondistended, nontender. Bowel sounds active throughout. There are no masses palpable. No hepatomegaly. Neurological: Alert and oriented to person place and time.  Skin: Skin is warm and dry. No rashes noted.  Tye Savoy, NP  08/24/2022, 8:03 AM

## 2022-08-27 ENCOUNTER — Ambulatory Visit (INDEPENDENT_AMBULATORY_CARE_PROVIDER_SITE_OTHER): Payer: BC Managed Care – PPO

## 2022-08-27 ENCOUNTER — Ambulatory Visit (INDEPENDENT_AMBULATORY_CARE_PROVIDER_SITE_OTHER): Payer: BC Managed Care – PPO | Admitting: Pulmonary Disease

## 2022-08-27 ENCOUNTER — Encounter: Payer: Self-pay | Admitting: Pulmonary Disease

## 2022-08-27 VITALS — BP 104/66 | HR 81 | Temp 98.0°F | Ht 63.5 in | Wt 193.4 lb

## 2022-08-27 DIAGNOSIS — K219 Gastro-esophageal reflux disease without esophagitis: Secondary | ICD-10-CM | POA: Diagnosis not present

## 2022-08-27 DIAGNOSIS — J45991 Cough variant asthma: Secondary | ICD-10-CM

## 2022-08-27 DIAGNOSIS — J309 Allergic rhinitis, unspecified: Secondary | ICD-10-CM

## 2022-08-27 DIAGNOSIS — J189 Pneumonia, unspecified organism: Secondary | ICD-10-CM | POA: Diagnosis not present

## 2022-08-27 DIAGNOSIS — G4733 Obstructive sleep apnea (adult) (pediatric): Secondary | ICD-10-CM

## 2022-08-27 LAB — PULMONARY FUNCTION TEST
DL/VA % pred: 97 %
DL/VA: 4.11 ml/min/mmHg/L
DLCO cor % pred: 87 %
DLCO cor: 17.53 ml/min/mmHg
DLCO unc % pred: 87 %
DLCO unc: 17.53 ml/min/mmHg
FEF 25-75 Post: 2.21 L/sec
FEF 25-75 Pre: 2.19 L/sec
FEF2575-%Change-Post: 0 %
FEF2575-%Pred-Post: 93 %
FEF2575-%Pred-Pre: 92 %
FEV1-%Change-Post: 1 %
FEV1-%Pred-Post: 92 %
FEV1-%Pred-Pre: 90 %
FEV1-Post: 2.35 L
FEV1-Pre: 2.3 L
FEV1FVC-%Change-Post: 0 %
FEV1FVC-%Pred-Pre: 102 %
FEV6-%Change-Post: 1 %
FEV6-%Pred-Post: 91 %
FEV6-%Pred-Pre: 90 %
FEV6-Post: 2.89 L
FEV6-Pre: 2.86 L
FEV6FVC-%Pred-Post: 103 %
FEV6FVC-%Pred-Pre: 103 %
FVC-%Change-Post: 1 %
FVC-%Pred-Post: 88 %
FVC-%Pred-Pre: 87 %
FVC-Post: 2.89 L
FVC-Pre: 2.86 L
Post FEV1/FVC ratio: 81 %
Post FEV6/FVC ratio: 100 %
Pre FEV1/FVC ratio: 80 %
Pre FEV6/FVC Ratio: 100 %
RV % pred: 89 %
RV: 1.75 L
TLC % pred: 95 %
TLC: 4.74 L

## 2022-08-27 NOTE — Patient Instructions (Signed)
Obstructive sleep apnea: Keep using CPAP nightly No driving while sleepy If you have difficulty with mask fitting, unable to find an appropriate mask provided by the DME company let us know so that we can make arrangements for a mask fitting class for you.  Right lower lobe pneumonia: Repeat chest x-ray today  Allergic rhinitis: Continue fluticasone, cetirizine  Gastroesophageal reflux disease: Continue to avoid fatty foods, chocolate, mint, no eating within 3 hours of bedtime Omeprazole to continue  Follow-up in 1 year with a sleep specialist

## 2022-08-27 NOTE — Patient Instructions (Signed)
Full PFT Performed Today. 

## 2022-08-27 NOTE — Progress Notes (Signed)
Synopsis: Referred in December 2023 for chronic cough Found to have pneumonia Diagnosed with OSA, started on CPAP PFT normal  Subjective:   PATIENT ID: Joanne Brewer GENDER: female DOB: 09-04-1960, MRN: [id number]   HPI  Chief Complaint  Patient presents with   Follow-up    Cxr 1/4, pft review    Joanne Brewer has been doing well since the last visit.  Cough has improved, no mucus production.  No problems with shortness of breath or chest tightness.  She is here to follow-up the lung function test and have a repeat chest x-ray.  She started CPAP therapy and she has found that it makes her congested at night.  She is trying a new mask tonight  Past Medical History:  Diagnosis Date   Cancer ([place])    basil cell   Cardiomegaly 1991   POSTPARTUM - after vaginal delivery   CHF (congestive heart failure) ([place]) 1992   POSTPARTUM- after vaginal delivery   Fibroids    Hyperlipidemia    Hypertension    POSTPARTUM   Osteopenia      Review of Systems  Constitutional:  Negative for chills, fever, malaise/fatigue and weight loss.  HENT:  Negative for congestion, sinus pain and sore throat.   Respiratory:  Negative for cough, sputum production and shortness of breath.   Cardiovascular:  Negative for chest pain and leg swelling.      Objective:  Physical Exam   Vitals:   08/27/22 1005  BP: 104/66  Pulse: 81  Temp: 98 F (36.7 C)  TempSrc: Oral  SpO2: 95%  Weight: 193 lb 6.4 oz (87.7 kg)  Height: 5' 3.5" (1.613 m)    Gen: well appearing HENT: OP clear, neck supple PULM: CTA B, normal effort  CV: RRR, no mgr GI: BS+, soft, nontender Derm: no cyanosis or rash Psyche: normal mood and affect     CBC    Component Value Date/Time   WBC 6.6 01/17/2021 1201   RBC 4.79 01/17/2021 1201   HGB 13.9 01/17/2021 1201   HCT 42.1 01/17/2021 1201   PLT 244 01/17/2021 1201   MCV 87.9 01/17/2021 1201   MCH 29.0 01/17/2021 1201   MCHC 33.0 01/17/2021 1201   RDW 12.6 01/17/2021  1201   LYMPHSABS 1.7 01/17/2021 1201   MONOABS 0.6 01/17/2021 1201   EOSABS 0.1 01/17/2021 1201   BASOSABS 0.0 01/17/2021 1201     Chest imaging: July 2022 CT angiogram chest images independently reviewed showing hiatal hernia, normal pulmonary parenchyma with the exception of a calcified left lower lobe granuloma, tiny right middle lobe pulmonary nodules 4 mm in size and 2 mm in size, no pulmonary embolism January 2024 residual right lower lobe pneumonia  PFT: 06/2022 FENO: 21 PPB February 2024 ratio 81%, FVC 2.89 L 88% predicted, total lung capacity 4.74 L 95% predicted, DLCO 17.5 387% predicted  Labs:  Path:  Echo:  Heart Catheterization:       Assessment & Plan:   OSA (obstructive sleep apnea)  Pneumonia of right lower lobe due to infectious organism  Allergic rhinitis, unspecified seasonality, unspecified trigger  Gastroesophageal reflux disease, unspecified whether esophagitis present  Discussion: 62 year old female with GERD, allergic rhinitis leading to upper airway cough syndrome.  Also recovering from pneumonia several weeks ago.  Recently diagnosed with obstructive sleep apnea.  In general, doing well.  Having some difficulty with her mask.  Plan: Obstructive sleep apnea: Keep using CPAP nightly No driving while sleepy If you have difficulty with mask  fitting, unable to find an appropriate mask provided by the DME company let us know so that we can make arrangements for a mask fitting class for you.  Right lower lobe pneumonia: Repeat chest x-ray today  Allergic rhinitis: Continue fluticasone, cetirizine  Gastroesophageal reflux disease: Continue to avoid fatty foods, chocolate, mint, no eating within 3 hours of bedtime Omeprazole to continue  Follow-up in 1 year with a sleep specialist  Immunizations: Immunization History  Administered Date(s) Administered   MMR 08/08/2005   Tdap 09/02/2011   Unspecified SARS-COV-2 Vaccination 06/12/2020,  07/16/2020   Zoster Recombinat (Shingrix) 01/20/2018, 04/14/2018     Current Outpatient Medications:    acetaminophen (TYLENOL) 500 MG tablet, Take 1,000 mg by mouth as needed (pain)., Disp: , Rfl:    albuterol (VENTOLIN HFA) 108 (90 Base) MCG/ACT inhaler, Inhale 1-2 puffs into the lungs every 6 (six) hours as needed., Disp: , Rfl:    aspirin 81 MG EC tablet, Take 81 mg by mouth daily., Disp: , Rfl:    atorvastatin (LIPITOR) 10 MG tablet, Take 10 mg by mouth at bedtime., Disp: , Rfl:    Budesonide (PULMICORT FLEXHALER) 90 MCG/ACT inhaler, Inhale 2 puffs into the lungs 2 (two) times daily., Disp: 1 each, Rfl: 5   CALCIUM PO, Take 1 tablet by mouth daily., Disp: , Rfl:    cetirizine (ZYRTEC) 10 MG chewable tablet, Chew 10 mg by mouth at bedtime., Disp: , Rfl:    Docusate Sodium (DSS) 100 MG CAPS, Take 300 mg by mouth at bedtime., Disp: , Rfl:    hydrocortisone (ANUSOL-HC) 2.5 % rectal cream, Place 1 Application rectally at bedtime for 7 days., Disp: 30 g, Rfl: 2   losartan-hydrochlorothiazide (HYZAAR) 100-12.5 MG tablet, Take 0.5 tablets by mouth daily., Disp: , Rfl:    magnesium gluconate (MAGONATE) 500 MG tablet, Take 500 mg by mouth daily., Disp: , Rfl:    metoprolol succinate (TOPROL-XL) 25 MG 24 hr tablet, TAKE 1 TABLET (25 MG TOTAL) BY MOUTH DAILY., Disp: 90 tablet, Rfl: 3   NON FORMULARY, Pulmicort flexhaler, Disp: , Rfl:    omeprazole (PRILOSEC) 40 MG capsule, Take 40 mg by mouth every morning., Disp: , Rfl:

## 2022-08-27 NOTE — Progress Notes (Signed)
Full PFT Performed Today. 

## 2022-08-28 ENCOUNTER — Telehealth: Payer: Self-pay | Admitting: Pulmonary Disease

## 2022-08-28 NOTE — Telephone Encounter (Signed)
Called and spoke with patient. Patient stated she wanted to know what her xray results were.   DM, please advise.

## 2022-08-31 NOTE — Telephone Encounter (Signed)
Called and spoke with patient. Gave patient xray results. She verbalized understanding.   Nothing further needed.

## 2022-09-18 DIAGNOSIS — G4733 Obstructive sleep apnea (adult) (pediatric): Secondary | ICD-10-CM | POA: Diagnosis not present

## 2022-09-24 DIAGNOSIS — I159 Secondary hypertension, unspecified: Secondary | ICD-10-CM | POA: Diagnosis not present

## 2022-09-24 DIAGNOSIS — I088 Other rheumatic multiple valve diseases: Secondary | ICD-10-CM | POA: Diagnosis not present

## 2022-10-05 DIAGNOSIS — Z6831 Body mass index (BMI) 31.0-31.9, adult: Secondary | ICD-10-CM | POA: Diagnosis not present

## 2022-10-05 DIAGNOSIS — Z1151 Encounter for screening for human papillomavirus (HPV): Secondary | ICD-10-CM | POA: Diagnosis not present

## 2022-10-05 DIAGNOSIS — Z1231 Encounter for screening mammogram for malignant neoplasm of breast: Secondary | ICD-10-CM | POA: Diagnosis not present

## 2022-10-05 DIAGNOSIS — R87615 Unsatisfactory cytologic smear of cervix: Secondary | ICD-10-CM | POA: Diagnosis not present

## 2022-10-05 DIAGNOSIS — Z01419 Encounter for gynecological examination (general) (routine) without abnormal findings: Secondary | ICD-10-CM | POA: Diagnosis not present

## 2022-10-05 DIAGNOSIS — Z124 Encounter for screening for malignant neoplasm of cervix: Secondary | ICD-10-CM | POA: Diagnosis not present

## 2022-10-05 DIAGNOSIS — Z13820 Encounter for screening for osteoporosis: Secondary | ICD-10-CM | POA: Diagnosis not present

## 2022-10-06 ENCOUNTER — Ambulatory Visit: Payer: BC Managed Care – PPO | Admitting: Adult Health

## 2022-10-14 ENCOUNTER — Encounter: Payer: Self-pay | Admitting: Gastroenterology

## 2022-10-14 DIAGNOSIS — I10 Essential (primary) hypertension: Secondary | ICD-10-CM | POA: Diagnosis not present

## 2022-10-14 DIAGNOSIS — G4733 Obstructive sleep apnea (adult) (pediatric): Secondary | ICD-10-CM | POA: Diagnosis not present

## 2022-10-14 DIAGNOSIS — I5032 Chronic diastolic (congestive) heart failure: Secondary | ICD-10-CM | POA: Diagnosis not present

## 2022-10-19 ENCOUNTER — Ambulatory Visit (INDEPENDENT_AMBULATORY_CARE_PROVIDER_SITE_OTHER): Payer: BC Managed Care – PPO | Admitting: Adult Health

## 2022-10-19 ENCOUNTER — Encounter: Payer: Self-pay | Admitting: Adult Health

## 2022-10-19 VITALS — BP 98/60 | HR 77 | Temp 97.7°F | Ht 64.0 in | Wt 183.2 lb

## 2022-10-19 DIAGNOSIS — J309 Allergic rhinitis, unspecified: Secondary | ICD-10-CM

## 2022-10-19 DIAGNOSIS — G4733 Obstructive sleep apnea (adult) (pediatric): Secondary | ICD-10-CM | POA: Diagnosis not present

## 2022-10-19 HISTORY — DX: Obstructive sleep apnea (adult) (pediatric): G47.33

## 2022-10-19 NOTE — Progress Notes (Signed)
@Patient  ID: Joanne Brewer, female    DOB: 01-11-1961, 62 y.o.   MRN: [id number]  Chief Complaint  Patient presents with   Follow-up    Referring provider: Tally Joe, MD  HPI: 62 year old female followed for upper airway cough syndrome and sleep apnea  TEST/EVENTS :  Home sleep study August 03, 2022 showed severe sleep apnea with AHI at 49.8/hour and SpO2 low at 82%.  Pulmonary function testing done August 27, 2022 showed normal lung function with FEV1 at 92%, ratio 81, FVC 88%, no significant bronchodilator response, DLCO 87%.  10/19/2022 Follow up : OSA and Cough  Patient returns for a 6-week follow-up.  Patient has underlying severe sleep apnea is on nocturnal CPAP.  Patient says that she is doing okay on CPAP therapy.  She is try to do it.  Does feel that the pressure is too high at times.  CPAP download shows 90% compliance with daily average usage at 5 hours.  Patient is on auto CPAP 5 to 20 cm H2O.  AHI is 3.0/hour.  Daily average pressure around 11.7 cm H2O.  Positive mask leaks. Using Full nasal mask, works okay but is uncomfortable .  Uses [employer] [facility]. Does feel better since starting CPAP , feels less tired. B/P has been better since starting .   Patient has had an upper airway cough syndrome felt secondary to postnasal drainage.  She is on Flonase and Zyrtec.  Patient says overall she is doing well. Cough has resolved.   Patient did have pneumonia in December 2023 treated with antibiotics.  Follow-up chest x-ray August 27, 2022 showed clear lungs. Feels much better.    Allergies  Allergen Reactions   Ciprofloxacin Other (See Comments)    Pt states she passed out     Immunization History  Administered Date(s) Administered   Janssen (J&J) SARS-COV-2 Vaccination 06/12/2020, 07/16/2020   MMR 08/08/2005   Tdap 09/02/2011   Unspecified SARS-COV-2 Vaccination 06/12/2020, 07/16/2020   Zoster Recombinat (Shingrix) 01/20/2018, 04/14/2018    Past Medical History:   Diagnosis Date   Cancer    basil cell   Cardiomegaly 1991   POSTPARTUM - after vaginal delivery   CHF (congestive heart failure) 1992   POSTPARTUM- after vaginal delivery   Fibroids    Hyperlipidemia    Hypertension    POSTPARTUM   OSA (obstructive sleep apnea) 10/19/2022   Osteopenia     Tobacco History: Social History   Tobacco Use  Smoking Status Never  Smokeless Tobacco Never   Counseling given: Not Answered   Outpatient Medications Prior to Visit  Medication Sig Dispense Refill   acetaminophen (TYLENOL) 500 MG tablet Take 1,000 mg by mouth as needed (pain).     albuterol (VENTOLIN HFA) 108 (90 Base) MCG/ACT inhaler Inhale 1-2 puffs into the lungs every 6 (six) hours as needed.     aspirin 81 MG EC tablet Take 81 mg by mouth daily.     atorvastatin (LIPITOR) 10 MG tablet Take 10 mg by mouth at bedtime.     Budesonide (PULMICORT FLEXHALER) 90 MCG/ACT inhaler Inhale 2 puffs into the lungs 2 (two) times daily. 1 each 5   CALCIUM PO Take 1 tablet by mouth daily.     cetirizine (ZYRTEC) 10 MG chewable tablet Chew 10 mg by mouth at bedtime.     Docusate Sodium (DSS) 100 MG CAPS Take 300 mg by mouth at bedtime.     losartan-hydrochlorothiazide (HYZAAR) 100-12.5 MG tablet Take 0.5 tablets by mouth daily.  magnesium gluconate (MAGONATE) 500 MG tablet Take 500 mg by mouth daily.     metoprolol succinate (TOPROL-XL) 25 MG 24 hr tablet TAKE 1 TABLET (25 MG TOTAL) BY MOUTH DAILY. 90 tablet 3   NON FORMULARY Pulmicort flexhaler     omeprazole (PRILOSEC) 40 MG capsule Take 40 mg by mouth every morning. (Patient not taking: Reported on 10/19/2022)     No facility-administered medications prior to visit.     Review of Systems:   Constitutional:   No  weight loss, night sweats,  Fevers, chills, fatigue, or  lassitude.  HEENT:   No headaches,  Difficulty swallowing,  Tooth/dental problems, or  Sore throat,                No sneezing, itching, ear ache,  +nasal congestion, post  nasal drip,   CV:  No chest pain,  Orthopnea, PND, swelling in lower extremities, anasarca, dizziness, palpitations, syncope.   GI  No heartburn, indigestion, abdominal pain, nausea, vomiting, diarrhea, change in bowel habits, loss of appetite, bloody stools.   Resp: No shortness of breath with exertion or at rest.  No excess mucus, no productive cough,  No non-productive cough,  No coughing up of blood.  No change in color of mucus.  No wheezing.  No chest wall deformity  Skin: no rash or lesions.  GU: no dysuria, change in color of urine, no urgency or frequency.  No flank pain, no hematuria   MS:  No joint pain or swelling.  No decreased range of motion.  No back pain.    Physical Exam  BP 98/60 (BP Location: Left Arm, Patient Position: Sitting, Cuff Size: Large)   Pulse 77   Temp 97.7 F (36.5 C) (Oral)   Ht 5\' 4"  (1.626 m)   Wt 183 lb 3.2 oz (83.1 kg)   LMP 05/09/2016 (Approximate)   SpO2 97%   BMI 31.45 kg/m   GEN: A/Ox3; pleasant , NAD, well nourished    HEENT:  [place]/AT,  NOSE-clear, THROAT-clear, no lesions, no postnasal drip or exudate noted.  Class 2 MP airway   NECK:  Supple w/ fair ROM; no JVD; normal carotid impulses w/o bruits; no thyromegaly or nodules palpated; no lymphadenopathy.    RESP  Clear  P & A; w/o, wheezes/ rales/ or rhonchi. no accessory muscle use, no dullness to percussion  CARD:  RRR, no m/r/g, no peripheral edema, pulses intact, no cyanosis or clubbing.  GI:   Soft & nt; nml bowel sounds; no organomegaly or masses detected.   Musco: Warm bil, no deformities or joint swelling noted.   Neuro: alert, no focal deficits noted.    Skin: Warm, no lesions or rashes    Lab Results:       ProBNP No results found for: "PROBNP"  Imaging: No results found.       Latest Ref Rng & Units 08/27/2022    8:58 AM  PFT Results  FVC-Pre L 2.86   FVC-Predicted Pre % 87   FVC-Post L 2.89   FVC-Predicted Post % 88   Pre FEV1/FVC % % 80    Post FEV1/FCV % % 81   FEV1-Pre L 2.30   FEV1-Predicted Pre % 90   FEV1-Post L 2.35   DLCO uncorrected ml/min/mmHg 17.53   DLCO UNC% % 87   DLCO corrected ml/min/mmHg 17.53   DLCO COR %Predicted % 87   DLVA Predicted % 97   TLC L 4.74   TLC % Predicted % 95  RV % Predicted % 89     Lab Results  Component Value Date   NITRICOXIDE 21 06/29/2022        Assessment & Plan:   OSA (obstructive sleep apnea) Severe OSA - improving control and compliance  Change to dream wear nasal for comfort , decrease cpap pressure for comfort   Plan  Patient Instructions  Continue on CPAP at bedtime.  Goal is to wear CPAP all night long for at least 6 or more hours Decrease CPAP pressure 5 to 12 cm H2O. Try Dream wear nasal mask.  Do not drive if sleepy Work on healthy weight Continue on Zyrtec daily as needed Continue on Flonase daily as needed Saline nasal gel At bedtime   Saline nasal spray Twice daily  As needed  drainage  Follow up with Dr. Wynona Neat or Azure Budnick NP in 4 months and As needed       Chronic allergic rhinitis Continue on maintenance regimen , zyrtec ad flonase   Plan  Patient Instructions  Continue on CPAP at bedtime.  Goal is to wear CPAP all night long for at least 6 or more hours Decrease CPAP pressure 5 to 12 cm H2O. Try Dream wear nasal mask.  Do not drive if sleepy Work on healthy weight Continue on Zyrtec daily as needed Continue on Flonase daily as needed Saline nasal gel At bedtime   Saline nasal spray Twice daily  As needed  drainage  Follow up with Dr. Wynona Neat or Ronnell Makarewicz NP in 4 months and As needed         Rubye Oaks, NP 10/19/2022

## 2022-10-19 NOTE — Addendum Note (Signed)
Addended by: Delrae Rend on: 10/19/2022 04:19 PM   Modules accepted: Orders

## 2022-10-19 NOTE — Assessment & Plan Note (Signed)
Continue on maintenance regimen , zyrtec ad flonase   Plan  Patient Instructions  Continue on CPAP at bedtime.  Goal is to wear CPAP all night long for at least 6 or more hours Decrease CPAP pressure 5 to 12 cm H2O. Try Dream wear nasal mask.  Do not drive if sleepy Work on healthy weight Continue on Zyrtec daily as needed Continue on Flonase daily as needed Saline nasal gel At bedtime   Saline nasal spray Twice daily  As needed  drainage  Follow up with Dr. Wynona Neat or Crescentia Boutwell NP in 4 months and As needed

## 2022-10-19 NOTE — Assessment & Plan Note (Signed)
Severe OSA - improving control and compliance  Change to dream wear nasal for comfort , decrease cpap pressure for comfort   Plan  Patient Instructions  Continue on CPAP at bedtime.  Goal is to wear CPAP all night long for at least 6 or more hours Decrease CPAP pressure 5 to 12 cm H2O. Try Dream wear nasal mask.  Do not drive if sleepy Work on healthy weight Continue on Zyrtec daily as needed Continue on Flonase daily as needed Saline nasal gel At bedtime   Saline nasal spray Twice daily  As needed  drainage  Follow up with Dr. Wynona Neat or Amily Depp NP in 4 months and As needed

## 2022-10-19 NOTE — Patient Instructions (Addendum)
Continue on CPAP at bedtime.  Goal is to wear CPAP all night long for at least 6 or more hours Decrease CPAP pressure 5 to 12 cm H2O. Try Dream wear nasal mask.  Do not drive if sleepy Work on healthy weight Continue on Zyrtec daily as needed Continue on Flonase daily as needed Saline nasal gel At bedtime   Saline nasal spray Twice daily  As needed  drainage  Follow up with Dr. Wynona Neat or Sheylin Scharnhorst NP in 4 months and As needed

## 2022-10-23 ENCOUNTER — Encounter: Payer: Self-pay | Admitting: Gastroenterology

## 2022-10-23 ENCOUNTER — Encounter: Payer: Self-pay | Admitting: Adult Health

## 2022-10-23 ENCOUNTER — Ambulatory Visit (AMBULATORY_SURGERY_CENTER): Payer: BC Managed Care – PPO | Admitting: Gastroenterology

## 2022-10-23 VITALS — BP 105/67 | HR 56 | Temp 98.6°F | Resp 19 | Ht 64.0 in | Wt 195.0 lb

## 2022-10-23 DIAGNOSIS — Z860100 Personal history of colon polyps, unspecified: Secondary | ICD-10-CM

## 2022-10-23 DIAGNOSIS — Z09 Encounter for follow-up examination after completed treatment for conditions other than malignant neoplasm: Secondary | ICD-10-CM | POA: Diagnosis not present

## 2022-10-23 DIAGNOSIS — D122 Benign neoplasm of ascending colon: Secondary | ICD-10-CM

## 2022-10-23 DIAGNOSIS — Z1211 Encounter for screening for malignant neoplasm of colon: Secondary | ICD-10-CM | POA: Diagnosis not present

## 2022-10-23 MED ORDER — SODIUM CHLORIDE 0.9 % IV SOLN: 500.0000 mL | INTRAVENOUS | Status: DC

## 2022-10-23 NOTE — Op Note (Signed)
[facility] Patient Name: Joanne Brewer Procedure Date: 10/23/2022 8:35 AM MRN: [id number] Endoscopist: Viviann Spare P. Adela Lank , MD, [id number] Age: 62 Referring MD:  Date of Birth: [birth year] Gender: Female Account #: [id number] Procedure:                Colonoscopy Indications:              High risk colon cancer surveillance: Personal                            history of colonic polyps - 3 SSPs / adenomas -                            07/2019, history of hemorrhoids causing symptoms                            recently Medicines:                Monitored Anesthesia Care Procedure:                Pre-Anesthesia Assessment:                           - Prior to the procedure, a History and Physical                            was performed, and patient medications and                            allergies were reviewed. The patient's tolerance of                            previous anesthesia was also reviewed. The risks                            and benefits of the procedure and the sedation                            options and risks were discussed with the patient.                            All questions were answered, and informed consent                            was obtained. Prior Anticoagulants: The patient has                            taken no anticoagulant or antiplatelet agents. ASA                            Grade Assessment: II - A patient with mild systemic                            disease. After reviewing the risks and benefits,  the patient was deemed in satisfactory condition to                            undergo the procedure.                           After obtaining informed consent, the colonoscope                            was passed under direct vision. Throughout the                            procedure, the patient's blood pressure, pulse, and                            oxygen saturations were monitored continuously. The                             PCF-HQ190L Colonoscope [id number] was introduced                            through the anus and advanced to the the cecum,                            identified by appendiceal orifice and ileocecal                            valve. The colonoscopy was performed without                            difficulty. The patient tolerated the procedure                            well. The quality of the bowel preparation was                            good. The ileocecal valve, appendiceal orifice, and                            rectum were photographed. Scope In: 8:46:27 AM Scope Out: 9:01:42 AM Scope Withdrawal Time: 0 hours 11 minutes 49 seconds  Total Procedure Duration: 0 hours 15 minutes 15 seconds  Findings:                 The perianal and digital rectal examinations were                            normal.                           A few small-mouthed diverticula were found in the                            sigmoid colon.  A diminutive polyp was found in the ascending                            colon. The polyp was sessile. The polyp was removed                            with a cold snare. Resection and retrieval were                            complete.                           Internal hemorrhoids were found during                            retroflexion. The hemorrhoids were medium-sized.                           The exam was otherwise without abnormality. Complications:            No immediate complications. Estimated blood loss:                            Minimal. Estimated Blood Loss:     Estimated blood loss was minimal. Impression:               - Diverticulosis in the sigmoid colon.                           - One diminutive polyp in the ascending colon,                            removed with a cold snare. Resected and retrieved.                           - Internal hemorrhoids.                           - The examination was  otherwise normal.                           Patient is a candidate for hemorrhoid banding                            should she want hemorrhoids treated.                           - The GI Genius (intelligent endoscopy module),                            computer-aided polyp detection system powered by AI                            was utilized to detect colorectal polyps through  enhanced visualization during colonoscopy. Recommendation:           - Patient has a contact number available for                            emergencies. The signs and symptoms of potential                            delayed complications were discussed with the                            patient. Return to normal activities tomorrow.                            Written discharge instructions were provided to the                            patient.                           - Resume previous diet.                           - Continue present medications.                           - Await pathology results.                           - Follow up in the office for hemorrhoid banding if                            interesting in pursuing additional hemorrhoid                            therapy Viviann Spare P. Zan Triska, MD 10/23/2022 9:07:49 AM This report has been signed electronically.

## 2022-10-23 NOTE — Progress Notes (Signed)
[facility] History and Physical   Primary Care Physician:  Tally Joe, MD   Reason for Procedure:   History of colon polyps  Plan:    colonoscopy     HPI: Joanne Brewer is a 62 y.o. female  here for colonoscopy  surveillance - 3 polyp removed 07/2019 - SSPs / adenomas.   Patient does have some bleeding symptoms suspected to be hemorrhoidal related. No family history of colon cancer known. Otherwise feels well without any cardiopulmonary symptoms.   I have discussed risks / benefits of anesthesia and endoscopic procedure with Corliss Marcus and they wish to proceed with the exams as outlined today.    Past Medical History:  Diagnosis Date   Allergy    Cancer    basil cell   Cardiomegaly 1991   POSTPARTUM - after vaginal delivery   CHF (congestive heart failure) 1992   POSTPARTUM- after vaginal delivery   Fibroids    Hyperlipidemia    Hypertension    POSTPARTUM   OSA (obstructive sleep apnea) 10/19/2022   Osteopenia    Sleep apnea     Past Surgical History:  Procedure Laterality Date   CESAREAN SECTION     2   COLONOSCOPY  05/2006   Jarold Motto   HEMORRHOIDECTOMY WITH HEMORRHOID BANDING     POLYPECTOMY     right shoulder surgery Right    sacrocoloplasty     TUBAL LIGATION     WISDOM TOOTH EXTRACTION      Prior to Admission medications   Medication Sig Start Date End Date Taking? Authorizing Provider  aspirin 81 MG EC tablet Take 81 mg by mouth daily.   Yes [provider]  atorvastatin (LIPITOR) 10 MG tablet Take 10 mg by mouth at bedtime.   Yes [provider]  CALCIUM PO Take 1 tablet by mouth daily.   Yes [provider]  cetirizine (ZYRTEC) 10 MG chewable tablet Chew 10 mg by mouth at bedtime.   Yes [provider]  Docusate Sodium (DSS) 100 MG CAPS Take 300 mg by mouth at bedtime. 06/09/17  Yes [provider]  losartan-hydrochlorothiazide (HYZAAR) 100-12.5 MG tablet Take 0.5 tablets by mouth  daily.   Yes [provider]  magnesium gluconate (MAGONATE) 500 MG tablet Take 500 mg by mouth daily.   Yes [provider]  metoprolol succinate (TOPROL-XL) 25 MG 24 hr tablet TAKE 1 TABLET (25 MG TOTAL) BY MOUTH DAILY. 02/23/22  Yes Lewayne Bunting, MD  acetaminophen (TYLENOL) 500 MG tablet Take 1,000 mg by mouth as needed (pain). 06/09/17   [provider]  albuterol (VENTOLIN HFA) 108 (90 Base) MCG/ACT inhaler Inhale 1-2 puffs into the lungs every 6 (six) hours as needed. 05/26/22   [provider]  Budesonide (PULMICORT FLEXHALER) 90 MCG/ACT inhaler Inhale 2 puffs into the lungs 2 (two) times daily. 06/29/22   Lupita Leash, MD  NON FORMULARY Pulmicort flexhaler    [provider]    Current Outpatient Medications  Medication Sig Dispense Refill   aspirin 81 MG EC tablet Take 81 mg by mouth daily.     atorvastatin (LIPITOR) 10 MG tablet Take 10 mg by mouth at bedtime.     CALCIUM PO Take 1 tablet by mouth daily.     cetirizine (ZYRTEC) 10 MG chewable tablet Chew 10 mg by mouth at bedtime.     Docusate Sodium (DSS) 100 MG CAPS Take 300 mg by mouth at bedtime.     losartan-hydrochlorothiazide (HYZAAR)  100-12.5 MG tablet Take 0.5 tablets by mouth daily.     magnesium gluconate (MAGONATE) 500 MG tablet Take 500 mg by mouth daily.     metoprolol succinate (TOPROL-XL) 25 MG 24 hr tablet TAKE 1 TABLET (25 MG TOTAL) BY MOUTH DAILY. 90 tablet 3   acetaminophen (TYLENOL) 500 MG tablet Take 1,000 mg by mouth as needed (pain).     albuterol (VENTOLIN HFA) 108 (90 Base) MCG/ACT inhaler Inhale 1-2 puffs into the lungs every 6 (six) hours as needed.     Budesonide (PULMICORT FLEXHALER) 90 MCG/ACT inhaler Inhale 2 puffs into the lungs 2 (two) times daily. 1 each 5   NON FORMULARY Pulmicort flexhaler     Current Facility-Administered Medications  Medication Dose Route Frequency Provider Last Rate Last Admin   0.9 %  sodium chloride infusion  500 mL  Intravenous Continuous Freeda Spivey, Willaim Rayas, MD        Allergies as of 10/23/2022 - Review Complete 10/23/2022  Allergen Reaction Noted   Ciprofloxacin Other (See Comments) 01/17/2021   Nirmatrelvir Other (See Comments) 10/23/2022   Other  10/23/2022   Ritonavir Other (See Comments) 10/23/2022    Family History  Problem Relation Age of Onset   Hypertension Mother    CVA Mother    AAA (abdominal aortic aneurysm) Mother    Hypertension Father    CVA Father    Colon cancer Neg Hx    Esophageal cancer Neg Hx    Stomach cancer Neg Hx    Colon polyps Neg Hx    Rectal cancer Neg Hx     Social History   Socioeconomic History   Marital status: Married    Spouse name: Not on file   Number of children: 4   Years of education: Not on file   Highest education level: Not on file  Occupational History   Occupation: physical therapist  Tobacco Use   Smoking status: Never   Smokeless tobacco: Never  Vaping Use   Vaping Use: Never used  Substance and Sexual Activity   Alcohol use: Yes    Comment: occassionally   Drug use: No   Sexual activity: Not on file  Other Topics Concern   Not on file  Social History Narrative   Not on file   Social Determinants of Health   Financial Resource Strain: Not on file  Food Insecurity: Not on file  Transportation Needs: Not on file  Physical Activity: Not on file  Stress: Not on file  Social Connections: Not on file  Intimate Partner Violence: Not on file    Review of Systems: All other review of systems negative except as mentioned in the HPI.  Physical Exam: Vital signs BP 105/66   Pulse 72   Temp 98.6 F (37 C)   Ht 5\' 4"  (1.626 m)   Wt 195 lb (88.5 kg)   LMP 05/09/2016 (Approximate)   SpO2 97%   BMI 33.47 kg/m   General:   Alert,  Well-developed, pleasant and cooperative in NAD Lungs:  Clear throughout to auscultation.   Heart:  Regular rate and rhythm Abdomen:  Soft, nontender and nondistended.   Neuro/Psych:  Alert  and cooperative. Normal mood and affect. A and O x 3  Harlin Rain, MD [facility]

## 2022-10-23 NOTE — Progress Notes (Signed)
Sedate, gd SR, tolerated procedure well, VSS, report to RN 

## 2022-10-23 NOTE — Progress Notes (Signed)
Called to room to assist during endoscopic procedure.  Patient ID and intended procedure confirmed with present staff. Received instructions for my participation in the procedure from the performing physician.  

## 2022-10-23 NOTE — Progress Notes (Signed)
Pt's states no medical or surgical changes since previsit or office visit. 

## 2022-10-23 NOTE — Patient Instructions (Signed)
Resume previous diet Continue present medications Await pathology results Handouts/information given for polyps, diverticulosis, hemorrhoids and hemorrhoid banding. Call the office to make banding appt if interested.  YOU HAD AN ENDOSCOPIC PROCEDURE TODAY AT THE [facility]:   Refer to the procedure report that was given to you for any specific questions about what was found during the examination.  If the procedure report does not answer your questions, please call your gastroenterologist to clarify.  If you requested that your care partner not be given the details of your procedure findings, then the procedure report has been included in a sealed envelope for you to review at your convenience later.  YOU SHOULD EXPECT: Some feelings of bloating in the abdomen. Passage of more gas than usual.  Walking can help get rid of the air that was put into your GI tract during the procedure and reduce the bloating. If you had a lower endoscopy (such as a colonoscopy or flexible sigmoidoscopy) you may notice spotting of blood in your stool or on the toilet paper. If you underwent a bowel prep for your procedure, you may not have a normal bowel movement for a few days.  Please Note:  You might notice some irritation and congestion in your nose or some drainage.  This is from the oxygen used during your procedure.  There is no need for concern and it should clear up in a day or so.  SYMPTOMS TO REPORT IMMEDIATELY:  Following lower endoscopy (colonoscopy):  Excessive amounts of blood in the stool  Significant tenderness or worsening of abdominal pains  Swelling of the abdomen that is new, acute  Fever of 100F or higher  For urgent or emergent issues, a gastroenterologist can be reached at any hour by calling [phone]. Do not use MyChart messaging for urgent concerns.   DIET:  We do recommend a small meal at first, but then you may proceed to your regular diet.  Drink plenty of fluids but  you should avoid alcoholic beverages for 24 hours.  ACTIVITY:  You should plan to take it easy for the rest of today and you should NOT DRIVE or use heavy machinery until tomorrow (because of the sedation medicines used during the test).    FOLLOW UP: Our staff will call the number listed on your records the next business day following your procedure.  We will call around 7:15- 8:00 am to check on you and address any questions or concerns that you may have regarding the information given to you following your procedure. If we do not reach you, we will leave a message.     If any biopsies were taken you will be contacted by phone or by letter within the next 1-3 weeks.  Please call us at [phone] if you have not heard about the biopsies in 3 weeks.   SIGNATURES/CONFIDENTIALITY: You and/or your care partner have signed paperwork which will be entered into your electronic medical record.  These signatures attest to the fact that that the information above on your After Visit Summary has been reviewed and is understood.  Full responsibility of the confidentiality of this discharge information lies with you and/or your care-partner.

## 2022-10-26 ENCOUNTER — Telehealth: Payer: Self-pay | Admitting: *Deleted

## 2022-10-26 NOTE — Telephone Encounter (Signed)
  Follow up Call-    Row Labels 10/23/2022    7:50 AM  Call back number   Section Header. No data exists in this row.   Post procedure Call Back phone  #   [phone]  Permission to leave phone message   Yes     Patient questions:  Do you have a fever, pain , or abdominal swelling? Yes.   Pain Score  0 *  Have you tolerated food without any problems? Yes.    Have you been able to return to your normal activities? Yes.    Do you have any questions about your discharge instructions: Diet   No. Medications  No. Follow up visit  No.  Do you have questions or concerns about your Care? No.  Actions: * If pain score is 4 or above: No action needed, pain <4.

## 2022-11-02 DIAGNOSIS — R399 Unspecified symptoms and signs involving the genitourinary system: Secondary | ICD-10-CM | POA: Diagnosis not present

## 2022-11-18 DIAGNOSIS — G4733 Obstructive sleep apnea (adult) (pediatric): Secondary | ICD-10-CM | POA: Diagnosis not present

## 2022-12-08 ENCOUNTER — Other Ambulatory Visit: Payer: Self-pay | Admitting: Cardiology

## 2022-12-19 DIAGNOSIS — G4733 Obstructive sleep apnea (adult) (pediatric): Secondary | ICD-10-CM | POA: Diagnosis not present

## 2023-01-04 IMAGING — DX DG CHEST 2V
2 series · 2 of 2 positions shown · non-contrast
Comparison: Radiograph 12/10/2011

CLINICAL DATA: Chest pain

EXAM:
CHEST - 2 VIEW

[chest pa]
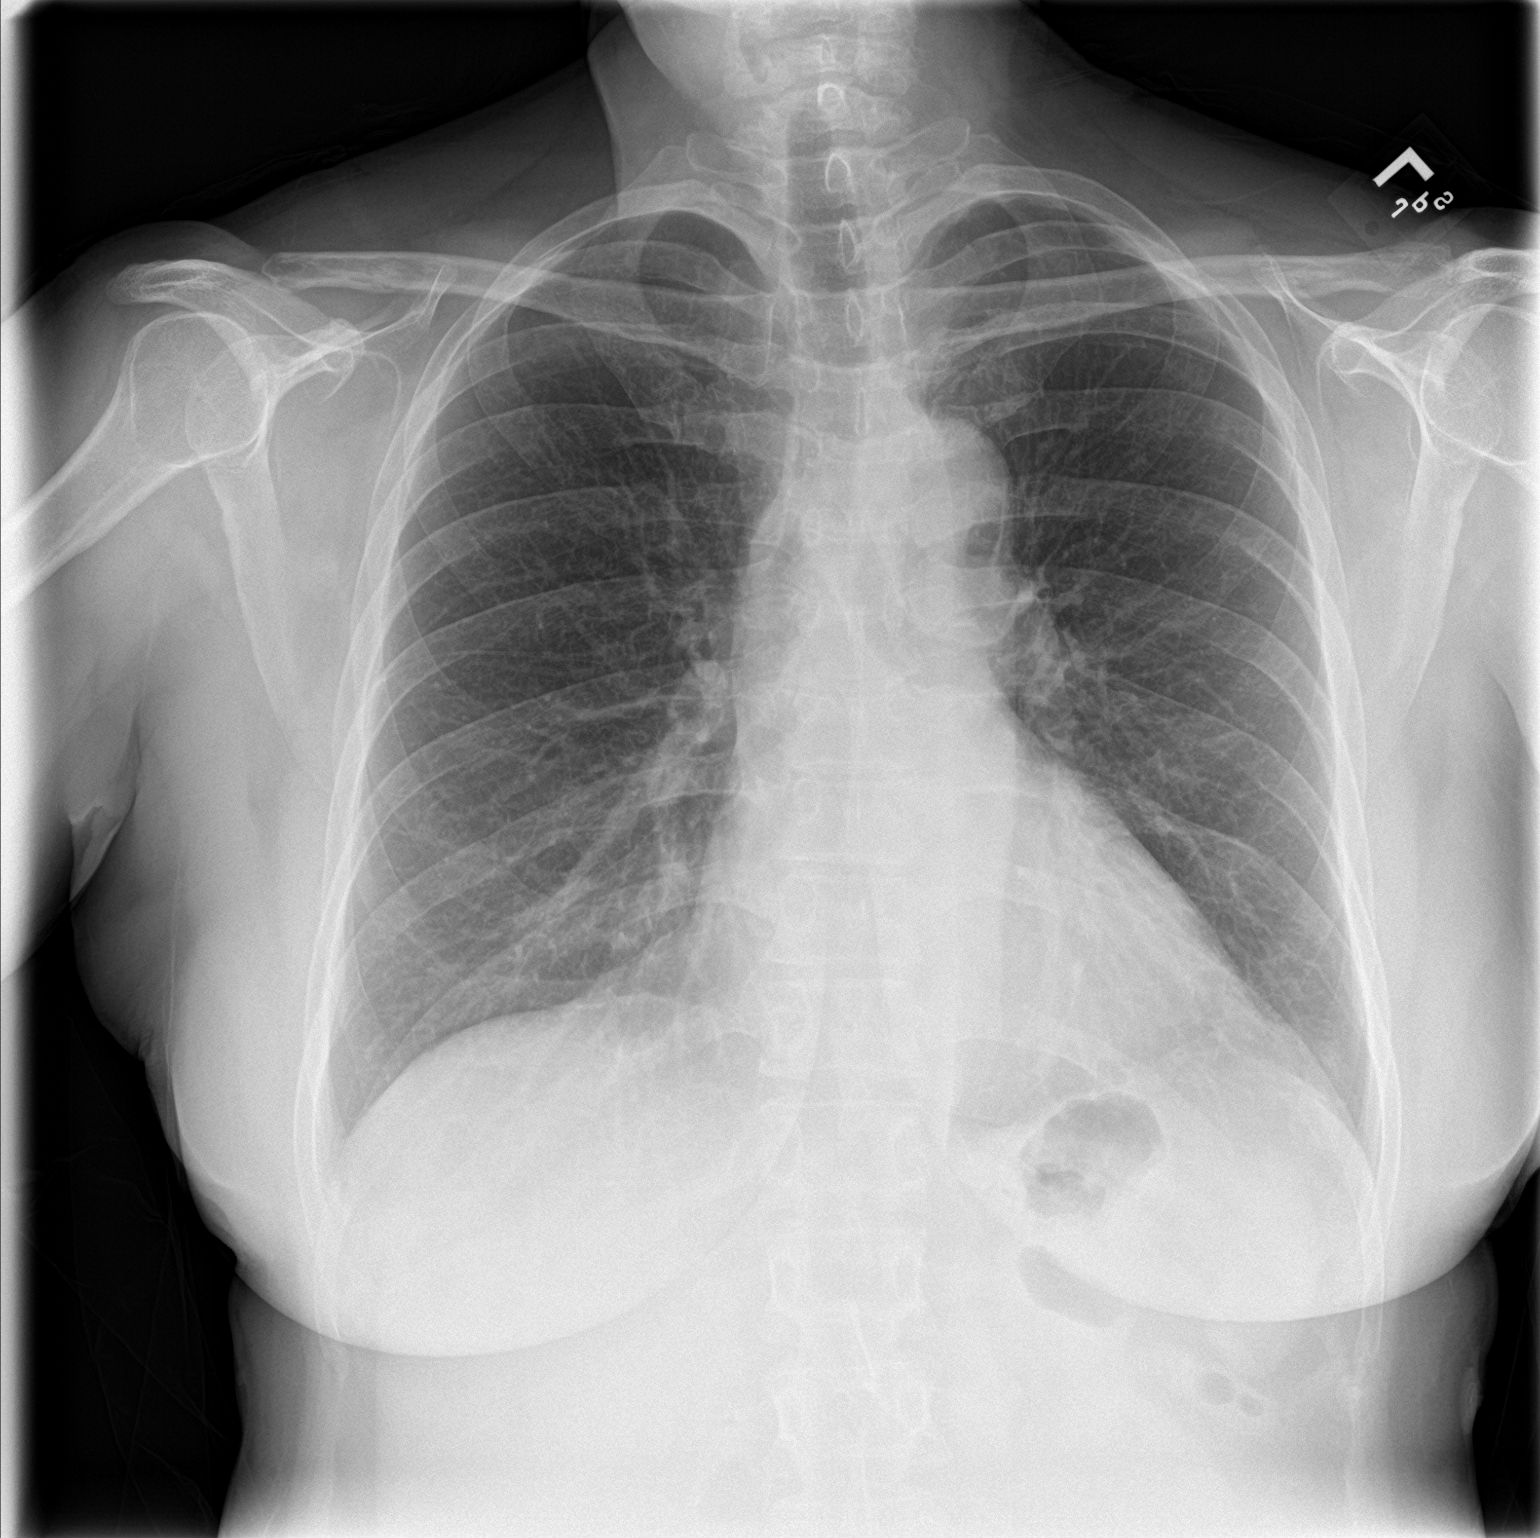

[chest lat]
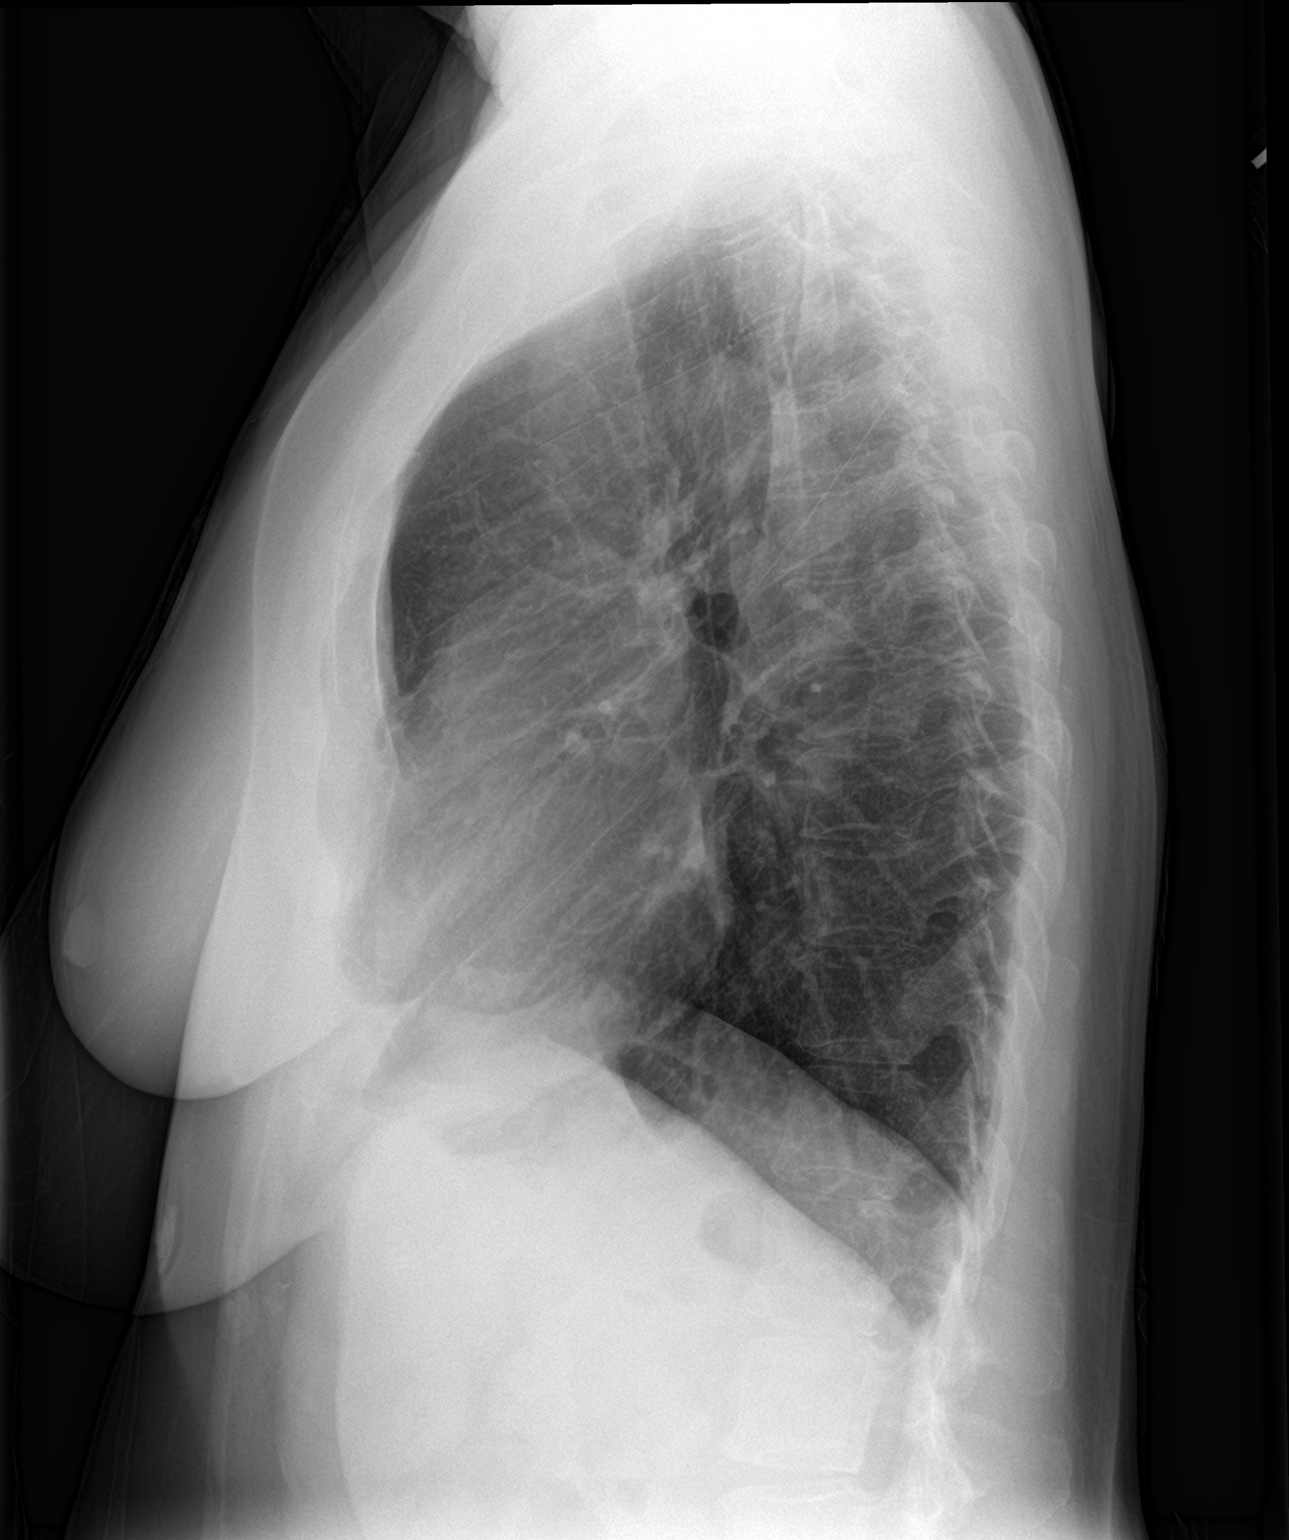

[2 of 2 positions shown; findings below may reference images not displayed]

FINDINGS: The heart size and mediastinal contours are within normal limits.
Both lungs are clear. The visualized skeletal structures are
unremarkable.
IMPRESSION: No evidence of acute cardiopulmonary disease.

## 2023-01-18 DIAGNOSIS — G4733 Obstructive sleep apnea (adult) (pediatric): Secondary | ICD-10-CM | POA: Diagnosis not present

## 2023-01-20 DIAGNOSIS — G4733 Obstructive sleep apnea (adult) (pediatric): Secondary | ICD-10-CM | POA: Diagnosis not present

## 2023-02-09 DIAGNOSIS — I10 Essential (primary) hypertension: Secondary | ICD-10-CM | POA: Diagnosis not present

## 2023-02-09 DIAGNOSIS — M8588 Other specified disorders of bone density and structure, other site: Secondary | ICD-10-CM | POA: Diagnosis not present

## 2023-02-09 DIAGNOSIS — E7800 Pure hypercholesterolemia, unspecified: Secondary | ICD-10-CM | POA: Diagnosis not present

## 2023-02-09 DIAGNOSIS — J4530 Mild persistent asthma, uncomplicated: Secondary | ICD-10-CM | POA: Diagnosis not present

## 2023-02-12 DIAGNOSIS — G4733 Obstructive sleep apnea (adult) (pediatric): Secondary | ICD-10-CM | POA: Diagnosis not present

## 2023-02-22 ENCOUNTER — Ambulatory Visit: Payer: BC Managed Care – PPO | Admitting: Adult Health

## 2023-02-23 ENCOUNTER — Encounter: Payer: Self-pay | Admitting: Adult Health

## 2023-02-23 ENCOUNTER — Ambulatory Visit (INDEPENDENT_AMBULATORY_CARE_PROVIDER_SITE_OTHER): Payer: BC Managed Care – PPO | Admitting: Adult Health

## 2023-02-23 VITALS — BP 110/86 | HR 68 | Ht 64.0 in | Wt 168.8 lb

## 2023-02-23 DIAGNOSIS — G4733 Obstructive sleep apnea (adult) (pediatric): Secondary | ICD-10-CM

## 2023-02-23 DIAGNOSIS — G4700 Insomnia, unspecified: Secondary | ICD-10-CM | POA: Insufficient documentation

## 2023-02-23 DIAGNOSIS — J309 Allergic rhinitis, unspecified: Secondary | ICD-10-CM | POA: Diagnosis not present

## 2023-02-23 MED ORDER — ZOLPIDEM TARTRATE 5 MG PO TABS: 5.0000 mg | ORAL_TABLET | Freq: Every evening | ORAL | 0 refills | Status: DC | PRN

## 2023-02-23 NOTE — Assessment & Plan Note (Signed)
Intermittent episodes of insomnia.  Healthy sleep regimen discussed with patient.  Patient may try Ambien 5 mg at bedtime as needed.  PMP was reviewed.  Advised patient to use with caution.  Only to use when she is using her CPAP.  Patient education given on sleep aids and potential side effects.  Plan  Patient Instructions  Continue on CPAP at bedtime.  Goal is to wear CPAP all night long for at least 6 or more hours Do not drive if sleepy May try Ambien 5mg  At bedtime  As needed   Healthy sleep regimen  Work on healthy weight Continue on Zyrtec daily as needed Continue on Flonase daily as needed Saline nasal gel At bedtime   Saline nasal spray Twice daily  As needed  drainage  Follow up with Dr. Wynona Neat or Renuka Farfan NP in 6 months and As needed    j

## 2023-02-23 NOTE — Patient Instructions (Addendum)
Continue on CPAP at bedtime.  Goal is to wear CPAP all night long for at least 6 or more hours Do not drive if sleepy May try Ambien 5mg  At bedtime  As needed   Healthy sleep regimen  Work on healthy weight Continue on Zyrtec daily as needed Continue on Flonase daily as needed Saline nasal gel At bedtime   Saline nasal spray Twice daily  As needed  drainage  Follow up with Dr. Wynona Neat or Brecken Walth NP in 6 months and As needed

## 2023-02-23 NOTE — Assessment & Plan Note (Signed)
Currently under good control.  Continue on current regimen

## 2023-02-23 NOTE — Assessment & Plan Note (Signed)
Encouraged on CPAP usage and compliance.  Patient is doing better on her current DreamWear nasal mask.  Plan  Patient Instructions  Continue on CPAP at bedtime.  Goal is to wear CPAP all night long for at least 6 or more hours Do not drive if sleepy May try Ambien 5mg  At bedtime  As needed   Healthy sleep regimen  Work on healthy weight Continue on Zyrtec daily as needed Continue on Flonase daily as needed Saline nasal gel At bedtime   Saline nasal spray Twice daily  As needed  drainage  Follow up with Dr. Wynona Neat or Nikkia Devoss NP in 6 months and As needed

## 2023-02-23 NOTE — Progress Notes (Signed)
@Patient  ID: Joanne Brewer, female    DOB: 01/13/1961, 62 y.o.   MRN: [id number]  Chief Complaint  Patient presents with   Follow-up    Referring provider: Tally Joe, MD  HPI: 62 yo female followed for upper airway cough syndrome and sleep apnea.   TEST/EVENTS :  Home sleep study August 03, 2022 showed severe sleep apnea with AHI at 49.8/hour and SpO2 low at 82%.   Pulmonary function testing done August 27, 2022 showed normal lung function with FEV1 at 92%, ratio 81, FVC 88%, no significant bronchodilator response, DLCO 87%.   02/23/2023 Follow up: OSA and Cough  Patient presents for a 8-month follow-up.  Patient has underlying severe sleep apnea.  She is on nocturnal CPAP.  Patient says she is trying to wear her CPAP each night.  She has recently changed to the DreamWear nasal which seems to be working much better.  She does have some trouble going to sleep and staying asleep at times.  Says she has had some chronic insomnia for a while.  CPAP download shows excellent compliance with 90% usage.  Daily average use at 5 hours.  Patient is on auto CPAP 5 to 12 cm H2O.  Daily average pressure at 8.8 cm H2O.  AHI 0.9.  Patient says she has been working on weight loss.  Is down 35 pounds.  Says she is trying to exercise most days.  Says she is does still gets sleepy especially late evening and if she does fall asleep briefly is hard for her to go to sleep later that night.  We discussed a healthy sleep regimen  Patient has a history of some intermittent cough.  She says that currently she is having no cough.  Does take Flonase and Zyrtec as needed.  Has had no flare of her nasal drainage.      Allergies  Allergen Reactions   Ciprofloxacin Other (See Comments)    Pt states she passed out    Nirmatrelvir Other (See Comments)   Other      Medication For Tx Of Meningitis     Ritonavir Other (See Comments)    diarrhea, nausea, bad taste in mouth    Immunization History   Administered Date(s) Administered   Janssen (J&J) SARS-COV-2 Vaccination 06/12/2020, 07/16/2020   MMR 08/08/2005   Tdap 09/02/2011   Unspecified SARS-COV-2 Vaccination 06/12/2020, 07/16/2020   Zoster Recombinant(Shingrix) 01/20/2018, 04/14/2018    Past Medical History:  Diagnosis Date   Allergy    Cancer (HCC)    basil cell   Cardiomegaly 1991   POSTPARTUM - after vaginal delivery   CHF (congestive heart failure) (HCC) 1992   POSTPARTUM- after vaginal delivery   Fibroids    Hyperlipidemia    Hypertension    POSTPARTUM   OSA (obstructive sleep apnea) 10/19/2022   Osteopenia    Sleep apnea     Tobacco History: Social History   Tobacco Use  Smoking Status Never  Smokeless Tobacco Never   Counseling given: Not Answered   Outpatient Medications Prior to Visit  Medication Sig Dispense Refill   acetaminophen (TYLENOL) 500 MG tablet Take 1,000 mg by mouth as needed (pain).     albuterol (VENTOLIN HFA) 108 (90 Base) MCG/ACT inhaler Inhale 1-2 puffs into the lungs every 6 (six) hours as needed.     aspirin 81 MG EC tablet Take 81 mg by mouth daily.     atorvastatin (LIPITOR) 10 MG tablet Take 10 mg by mouth at bedtime.  Budesonide (PULMICORT FLEXHALER) 90 MCG/ACT inhaler Inhale 2 puffs into the lungs 2 (two) times daily. 1 each 5   CALCIUM PO Take 1 tablet by mouth daily.     cetirizine (ZYRTEC) 10 MG chewable tablet Chew 10 mg by mouth at bedtime.     Docusate Sodium (DSS) 100 MG CAPS Take 300 mg by mouth at bedtime.     losartan (COZAAR) 25 MG tablet Take 25 mg by mouth daily.     magnesium gluconate (MAGONATE) 500 MG tablet Take 500 mg by mouth daily.     metoprolol succinate (TOPROL-XL) 25 MG 24 hr tablet TAKE 1 TABLET (25 MG TOTAL) BY MOUTH DAILY. 90 tablet 3   NON FORMULARY Pulmicort flexhaler     losartan-hydrochlorothiazide (HYZAAR) 100-12.5 MG tablet Take 0.5 tablets by mouth daily. (Patient not taking: Reported on 02/23/2023)     No facility-administered  medications prior to visit.     Review of Systems:   Constitutional:   No  weight loss, night sweats,  Fevers, chills, fatigue, or  lassitude.  HEENT:   No headaches,  Difficulty swallowing,  Tooth/dental problems, or  Sore throat,                No sneezing, itching, ear ache,  +nasal congestion, post nasal drip,   CV:  No chest pain,  Orthopnea, PND, swelling in lower extremities, anasarca, dizziness, palpitations, syncope.   GI  No heartburn, indigestion, abdominal pain, nausea, vomiting, diarrhea, change in bowel habits, loss of appetite, bloody stools.   Resp: No shortness of breath with exertion or at rest.  No excess mucus, no productive cough,  No non-productive cough,  No coughing up of blood.  No change in color of mucus.  No wheezing.  No chest wall deformity  Skin: no rash or lesions.  GU: no dysuria, change in color of urine, no urgency or frequency.  No flank pain, no hematuria   MS:  No joint pain or swelling.  No decreased range of motion.  No back pain.    Physical Exam  BP 110/86 (BP Location: Left Arm, Patient Position: Sitting, Cuff Size: Large)   Pulse 68   Ht 5\' 4"  (1.626 m)   Wt 168 lb 12.8 oz (76.6 kg)   LMP 05/09/2016 (Approximate)   SpO2 98%   BMI 28.97 kg/m   GEN: A/Ox3; pleasant , NAD, well nourished    HEENT:  [place]/AT,  NOSE-clear, THROAT-clear, no lesions, no postnasal drip or exudate noted.   NECK:  Supple w/ fair ROM; no JVD; normal carotid impulses w/o bruits; no thyromegaly or nodules palpated; no lymphadenopathy.    RESP  Clear  P & A; w/o, wheezes/ rales/ or rhonchi. no accessory muscle use, no dullness to percussion  CARD:  RRR, no m/r/g, no peripheral edema, pulses intact, no cyanosis or clubbing.  GI:   Soft & nt; nml bowel sounds; no organomegaly or masses detected.   Musco: Warm bil, no deformities or joint swelling noted.   Neuro: alert, no focal deficits noted.    Skin: Warm, no lesions or rashes    Lab  Results:      ProBNP No results found for: "PROBNP"  Imaging: No results found.  Administration History     None          Latest Ref Rng & Units 08/27/2022    8:58 AM  PFT Results  FVC-Pre L 2.86   FVC-Predicted Pre % 87   FVC-Post L 2.89  FVC-Predicted Post % 88   Pre FEV1/FVC % % 80   Post FEV1/FCV % % 81   FEV1-Pre L 2.30   FEV1-Predicted Pre % 90   FEV1-Post L 2.35   DLCO uncorrected ml/min/mmHg 17.53   DLCO UNC% % 87   DLCO corrected ml/min/mmHg 17.53   DLCO COR %Predicted % 87   DLVA Predicted % 97   TLC L 4.74   TLC % Predicted % 95   RV % Predicted % 89     Lab Results  Component Value Date   NITRICOXIDE 21 06/29/2022        Assessment & Plan:   OSA (obstructive sleep apnea) Encouraged on CPAP usage and compliance.  Patient is doing better on her current DreamWear nasal mask.  Plan  Patient Instructions  Continue on CPAP at bedtime.  Goal is to wear CPAP all night long for at least 6 or more hours Do not drive if sleepy May try Ambien 5mg  At bedtime  As needed   Healthy sleep regimen  Work on healthy weight Continue on Zyrtec daily as needed Continue on Flonase daily as needed Saline nasal gel At bedtime   Saline nasal spray Twice daily  As needed  drainage  Follow up with Dr. Wynona Neat or Zaharah Amir NP in 6 months and As needed      Insomnia Intermittent episodes of insomnia.  Healthy sleep regimen discussed with patient.  Patient may try Ambien 5 mg at bedtime as needed.  PMP was reviewed.  Advised patient to use with caution.  Only to use when she is using her CPAP.  Patient education given on sleep aids and potential side effects.  Plan  Patient Instructions  Continue on CPAP at bedtime.  Goal is to wear CPAP all night long for at least 6 or more hours Do not drive if sleepy May try Ambien 5mg  At bedtime  As needed   Healthy sleep regimen  Work on healthy weight Continue on Zyrtec daily as needed Continue on Flonase daily as  needed Saline nasal gel At bedtime   Saline nasal spray Twice daily  As needed  drainage  Follow up with Dr. Wynona Neat or Ludwika Rodd NP in 6 months and As needed    j  Chronic allergic rhinitis Currently under good control.  Continue on current regimen     Rubye Oaks, NP 02/23/2023

## 2023-02-27 IMAGING — CT CT HEART MORP W/ CTA COR W/ SCORE W/ CA W/CM &/OR W/O CM
4 of 7 series · 8 of 20 positions shown, 9 images · IV contrast (APPLIED)
Comparison: None.
COMPARISON: None.

Addendum:
EXAM:
OVER-READ INTERPRETATION  CT CHEST

The following report is an over-read performed by radiologist Dr.
Moni Munir [REDACTED] on 03/12/2021. This
over-read does not include interpretation of cardiac or coronary
anatomy or pathology. The coronary calcium score/coronary CTA
interpretation by the cardiologist is attached.
CLINICAL DATA: 60 year old with dyspnea, possible anginal
equivalent.
Cardiac/Coronary  CTA
TECHNIQUE: The patient was scanned on a [employer] Force scanner.

[Series 6: best diast · axial · 0.42mm/px · z∈[-199,-162]mm · 2 of 281 slices shown, 3 images]
[im 94/281  vessel]
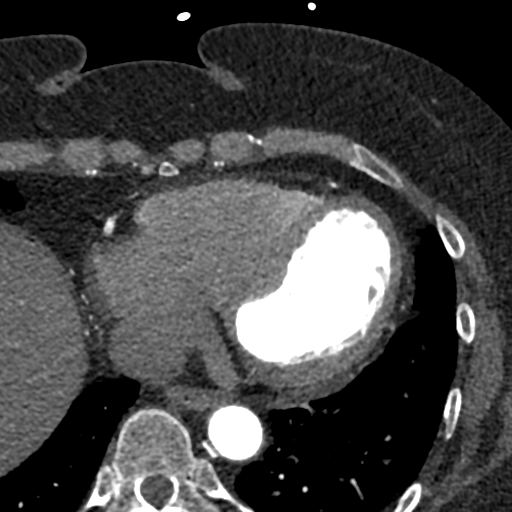
[im 94/281  lung]
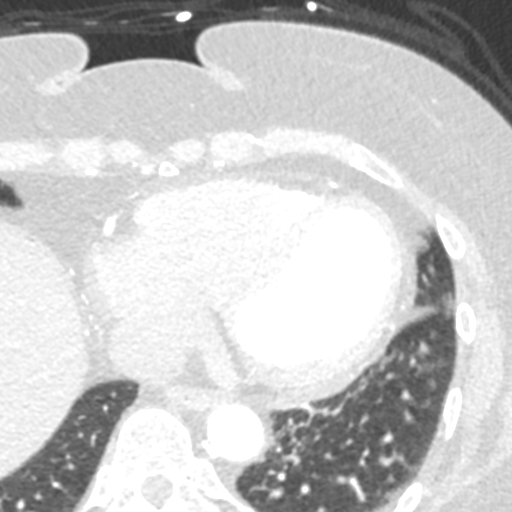
[im 187/281  vessel]
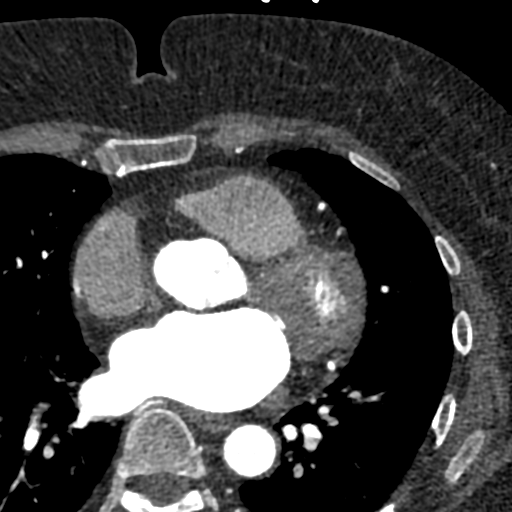

[Series 7: best syst · axial · 0.42mm/px · z∈[-199,-162]mm · 2 of 281 slices shown]
[im 94/281  vessel]
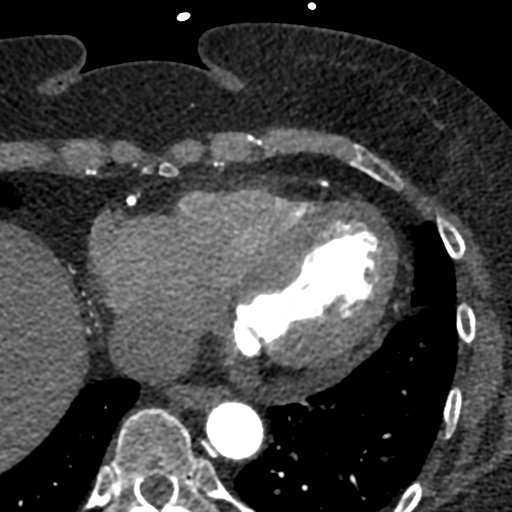
[im 187/281  vessel]
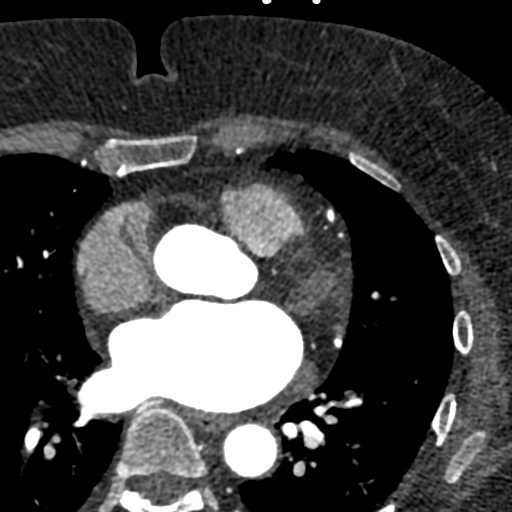

[Series 8: ts diast sharp 76 % · axial · 0.42mm/px · z∈[-199,-162]mm · 2 of 281 slices shown]
[im 94/281  lung]
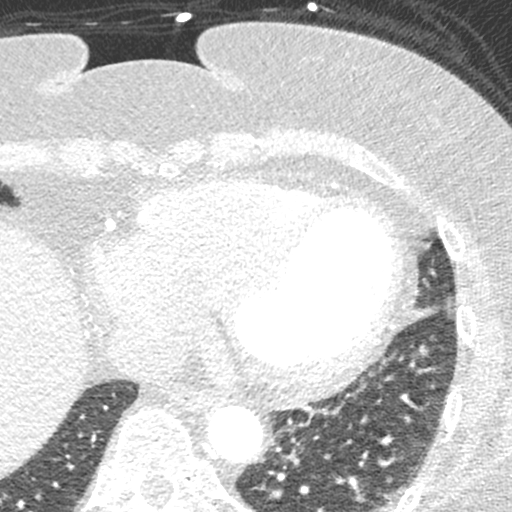
[im 187/281  lung]
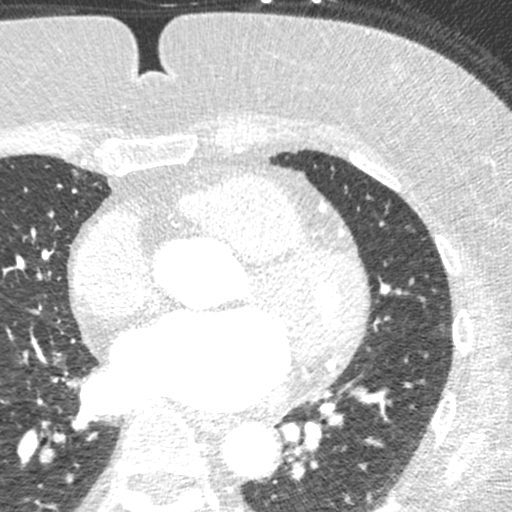

[Series 9: ts syst sharp · axial · 0.42mm/px · z∈[-199,-162]mm · 2 of 281 slices shown]
[im 94/281  lung]
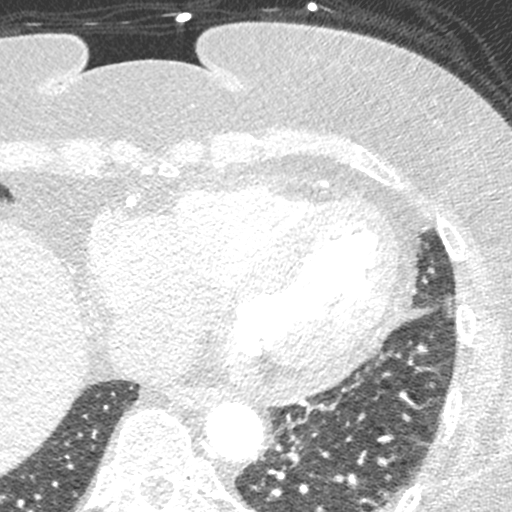
[im 187/281  lung]
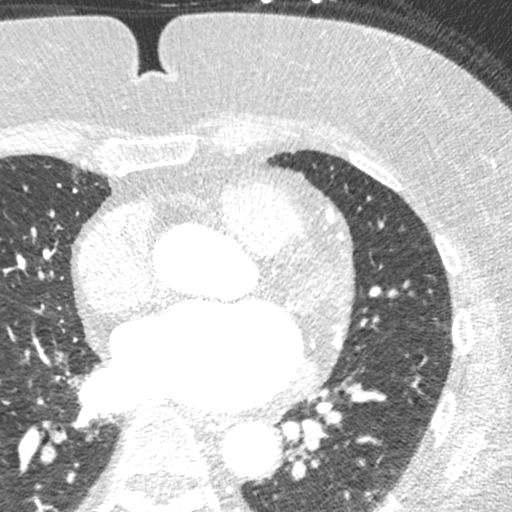

[8 of 20 positions shown; findings below may reference images not displayed]

FINDINGS: Atherosclerotic calcifications in the thoracic aorta. Within the
visualized portions of the thorax there are no suspicious appearing
pulmonary nodules or masses, there is no acute consolidative
airspace disease, no pleural effusions, no pneumothorax and no
lymphadenopathy. Visualized portions of the upper abdomen are
unremarkable. There are no aggressive appearing lytic or blastic
lesions noted in the visualized portions of the skeleton.
IMPRESSION: 1.  Aortic Atherosclerosis (0DU6C-G1F.F).
FINDINGS: A 100 kV prospective scan was triggered in the descending thoracic
aorta at 111 HU's. Axial non-contrast 3 mm slices were carried out
through the heart. The data set was analyzed on a dedicated work
station and scored using the Agatson method. Gantry rotation speed
was 250 msecs and collimation was .6 mm. No beta blockade and 0.8 mg
of sl NTG was given. The 3D data set was reconstructed in 5%
intervals of the 67-82 % of the R-R cycle. Diastolic phases were
analyzed on a dedicated work station using MPR, MIP and VRT modes.
The patient received 80 cc of contrast.

Aorta:  Normal size.  No calcifications.  No dissection.

Aortic Valve:  Trileaflet.  No calcifications.

Coronary Arteries:  Normal coronary origin.  Right dominance.

RCA is a large dominant artery that gives rise to PDA and PLA. There
is no plaque.

Left main is a large artery that gives rise to LAD and LCX arteries.

LAD is a large vessel that has no plaque.

LCX is a non-dominant artery that gives rise to one large OM1
branch. There is a small focal calcified proximal plaque, 0-24%
stenosis.

Other findings:

Normal pulmonary vein drainage into the left atrium.

Normal left atrial appendage without a thrombus.

Normal size of the pulmonary artery.

Please see radiology report for non cardiac findings.
IMPRESSION: 1. Coronary calcium score of 1 (LCX). This was 69 percentile for age
and sex matched control.

2. Normal coronary origin with right dominance.

3. Small focal calcified proximal circumflex plaque, 0-24% stenosis
.

4.  Aortic atherosclerosis

*** End of Addendum ***
EXAM:
OVER-READ INTERPRETATION  CT CHEST

The following report is an over-read performed by radiologist Dr.
Moni Munir [REDACTED] on 03/12/2021. This
over-read does not include interpretation of cardiac or coronary
anatomy or pathology. The coronary calcium score/coronary CTA
interpretation by the cardiologist is attached.
FINDINGS: Atherosclerotic calcifications in the thoracic aorta. Within the
visualized portions of the thorax there are no suspicious appearing
pulmonary nodules or masses, there is no acute consolidative
airspace disease, no pleural effusions, no pneumothorax and no
lymphadenopathy. Visualized portions of the upper abdomen are
unremarkable. There are no aggressive appearing lytic or blastic
lesions noted in the visualized portions of the skeleton.
IMPRESSION: 1.  Aortic Atherosclerosis (0DU6C-G1F.F).

## 2023-05-16 DIAGNOSIS — G4733 Obstructive sleep apnea (adult) (pediatric): Secondary | ICD-10-CM | POA: Diagnosis not present

## 2023-05-31 DIAGNOSIS — G4733 Obstructive sleep apnea (adult) (pediatric): Secondary | ICD-10-CM | POA: Diagnosis not present

## 2023-08-25 DIAGNOSIS — L821 Other seborrheic keratosis: Secondary | ICD-10-CM | POA: Diagnosis not present

## 2023-08-25 DIAGNOSIS — L578 Other skin changes due to chronic exposure to nonionizing radiation: Secondary | ICD-10-CM | POA: Diagnosis not present

## 2023-08-25 DIAGNOSIS — D225 Melanocytic nevi of trunk: Secondary | ICD-10-CM | POA: Diagnosis not present

## 2023-08-25 DIAGNOSIS — D235 Other benign neoplasm of skin of trunk: Secondary | ICD-10-CM | POA: Diagnosis not present

## 2023-08-27 ENCOUNTER — Encounter: Payer: Self-pay | Admitting: Adult Health

## 2023-08-27 ENCOUNTER — Ambulatory Visit: Payer: BC Managed Care – PPO | Admitting: Adult Health

## 2023-08-27 VITALS — BP 110/70 | HR 63 | Temp 98.1°F | Ht 64.0 in | Wt 182.2 lb

## 2023-08-27 DIAGNOSIS — G4700 Insomnia, unspecified: Secondary | ICD-10-CM

## 2023-08-27 DIAGNOSIS — G4733 Obstructive sleep apnea (adult) (pediatric): Secondary | ICD-10-CM

## 2023-08-27 DIAGNOSIS — J309 Allergic rhinitis, unspecified: Secondary | ICD-10-CM | POA: Diagnosis not present

## 2023-08-27 NOTE — Assessment & Plan Note (Signed)
Healthy sleep regimen discussed

## 2023-08-27 NOTE — Patient Instructions (Addendum)
Continue on CPAP at bedtime.  Goal is to wear CPAP all night long for at least 6 or more hours Do not drive if sleepy Healthy sleep regimen  Work on healthy weight Continue on Zyrtec daily as needed Continue on Flonase daily as needed Saline nasal gel At bedtime   Saline nasal spray Twice daily  As needed  drainage  Follow up with Dr. Wynona Neat or Lilianah Buffin NP in 1 year and As needed

## 2023-08-27 NOTE — Progress Notes (Signed)
@Patient  ID: Joanne Brewer, female    DOB: [birth year], 63 y.o.   MRN: [id number]  Chief Complaint  Patient presents with   Follow-up    Referring provider: Tally Joe, MD  HPI: 63 year old female followed for obstructive sleep apnea, insomnia and chronic cough   TEST/EVENTS :  Home sleep study August 03, 2022 showed severe sleep apnea with AHI at 49.8/hour and SpO2 low at 82%.    Pulmonary function testing done August 27, 2022 showed normal lung function with FEV1 at 92%, ratio 81, FVC 88%, no significant bronchodilator response, DLCO 87%.   Chest x-ray August 27, 2022 was clear  08/27/2023 Follow up: OSA, Insomnia and Chronic cough  Patient returns for a 70-month follow-up.  She has severe obstructive sleep apnea is on nocturnal CPAP.  Patient says overall she is doing well on CPAP.  She wears it most every night.  Is using a DreamWear nasal mask.  She feels that she benefits from CPAP with decreased daytime sleepiness.  CPAP download over the last 30 days shows excellent compliance with daily average usage at 6 hours she is on auto CPAP 5 to 12 cm H2O.  AHI is 1.2/hour daily average pressure at 10.0 cm H2O.  Last visit patient was having some difficulty with going to sleep and restless sleep.  We had discussed a healthy sleep regimen.  And given a short-term trial for Ambien 5 mg at bedtime.  Patient says her sleep is some better still has an occasional night where it takes her longer to go to sleep.  She did not use the Ambien felt that the side effects outweigh the benefits.  We discussed ongoing healthy sleep regimen.  Patient had some intermittent cough and chronic rhinitis.  She says that currently is having no cough.  Takes Zyrtec or Claritin most every day.  Uses Flonase as needed.  Denies any fever or discolored mucus      Allergies  Allergen Reactions   Ciprofloxacin Other (See Comments)    Pt states she passed out    Nirmatrelvir Other (See Comments)   Other       Medication For Tx Of Meningitis     Ritonavir Other (See Comments)    diarrhea, nausea, bad taste in mouth    Immunization History  Administered Date(s) Administered   Janssen (J&J) SARS-COV-2 Vaccination 06/12/2020, 07/16/2020   MMR 08/08/2005   Tdap 09/02/2011   Unspecified SARS-COV-2 Vaccination 06/12/2020, 07/16/2020   Zoster Recombinant(Shingrix) 01/20/2018, 04/14/2018    Past Medical History:  Diagnosis Date   Allergy    Cancer (HCC)    basil cell   Cardiomegaly 1991   POSTPARTUM - after vaginal delivery   CHF (congestive heart failure) (HCC) 1992   POSTPARTUM- after vaginal delivery   Fibroids    Hyperlipidemia    Hypertension    POSTPARTUM   OSA (obstructive sleep apnea) 10/19/2022   Osteopenia    Sleep apnea     Tobacco History: Social History   Tobacco Use  Smoking Status Never  Smokeless Tobacco Never   Counseling given: Not Answered   Outpatient Medications Prior to Visit  Medication Sig Dispense Refill   acetaminophen (TYLENOL) 500 MG tablet Take 1,000 mg by mouth as needed (pain).     aspirin 81 MG EC tablet Take 81 mg by mouth daily.     atorvastatin (LIPITOR) 10 MG tablet Take 10 mg by mouth at bedtime.     CALCIUM PO Take 1 tablet by mouth daily.  cetirizine (ZYRTEC) 10 MG chewable tablet Chew 10 mg by mouth at bedtime.     Docusate Sodium (DSS) 100 MG CAPS Take 300 mg by mouth at bedtime.     losartan (COZAAR) 25 MG tablet Take 25 mg by mouth daily.     magnesium gluconate (MAGONATE) 500 MG tablet Take 500 mg by mouth daily.     metoprolol succinate (TOPROL-XL) 25 MG 24 hr tablet TAKE 1 TABLET (25 MG TOTAL) BY MOUTH DAILY. 90 tablet 3   NON FORMULARY Pulmicort flexhaler     albuterol (VENTOLIN HFA) 108 (90 Base) MCG/ACT inhaler Inhale 1-2 puffs into the lungs every 6 (six) hours as needed. (Patient not taking: Reported on 08/27/2023)     Budesonide (PULMICORT FLEXHALER) 90 MCG/ACT inhaler Inhale 2 puffs into the lungs 2 (two) times  daily. (Patient not taking: Reported on 08/27/2023) 1 each 5   zolpidem (AMBIEN) 5 MG tablet Take 1 tablet (5 mg total) by mouth at bedtime as needed for sleep. (Patient not taking: Reported on 08/27/2023) 30 tablet 0   No facility-administered medications prior to visit.     Review of Systems:   Constitutional:   No  weight loss, night sweats,  Fevers, chills, fatigue, or  lassitude.  HEENT:   No headaches,  Difficulty swallowing,  Tooth/dental problems, or  Sore throat,                No sneezing, itching, ear ache,  +nasal congestion, post nasal drip,   CV:  No chest pain,  Orthopnea, PND, swelling in lower extremities, anasarca, dizziness, palpitations, syncope.   GI  No heartburn, indigestion, abdominal pain, nausea, vomiting, diarrhea, change in bowel habits, loss of appetite, bloody stools.   Resp:    No chest wall deformity  Skin: no rash or lesions.  GU: no dysuria, change in color of urine, no urgency or frequency.  No flank pain, no hematuria   MS:  No joint pain or swelling.  No decreased range of motion.  No back pain.    Physical Exam  BP 110/70 (BP Location: Left Arm, Patient Position: Sitting, Cuff Size: Large)   Pulse 63   Temp 98.1 F (36.7 C) (Oral)   Ht 5\' 4"  (1.626 m)   Wt 182 lb 3.2 oz (82.6 kg)   LMP 05/09/2016 (Approximate)   SpO2 97%   BMI 31.27 kg/m   GEN: A/Ox3; pleasant , NAD, well nourished    HEENT:  [place]/AT,   NOSE-clear, THROAT-clear, no lesions, no postnasal drip or exudate noted.   NECK:  Supple w/ fair ROM; no JVD; normal carotid impulses w/o bruits; no thyromegaly or nodules palpated; no lymphadenopathy.    RESP  Clear  P & A; w/o, wheezes/ rales/ or rhonchi. no accessory muscle use, no dullness to percussion  CARD:  RRR, no m/r/g, no peripheral edema, pulses intact, no cyanosis or clubbing.  GI:   Soft & nt; nml bowel sounds; no organomegaly or masses detected.   Musco: Warm bil, no deformities or joint swelling noted.   Neuro:  alert, no focal deficits noted.    Skin: Warm, no lesions or rashes    Lab Results:  CBC     ProBNP No results found for: "PROBNP"  Imaging: No results found.  Administration History     None          Latest Ref Rng & Units 08/27/2022    8:58 AM  PFT Results  FVC-Pre L 2.86   FVC-Predicted  Pre % 87   FVC-Post L 2.89   FVC-Predicted Post % 88   Pre FEV1/FVC % % 80   Post FEV1/FCV % % 81   FEV1-Pre L 2.30   FEV1-Predicted Pre % 90   FEV1-Post L 2.35   DLCO uncorrected ml/min/mmHg 17.53   DLCO UNC% % 87   DLCO corrected ml/min/mmHg 17.53   DLCO COR %Predicted % 87   DLVA Predicted % 97   TLC L 4.74   TLC % Predicted % 95   RV % Predicted % 89     Lab Results  Component Value Date   NITRICOXIDE 21 06/29/2022        Assessment & Plan:   OSA (obstructive sleep apnea) Severe obstructive sleep apnea-excellent control and compliance on CPAP.  Patient has perceived benefit on CPAP.  Continue on current settings  Plan  Patient Instructions  Continue on CPAP at bedtime.  Goal is to wear CPAP all night long for at least 6 or more hours Do not drive if sleepy Healthy sleep regimen  Work on healthy weight Continue on Zyrtec daily as needed Continue on Flonase daily as needed Saline nasal gel At bedtime   Saline nasal spray Twice daily  As needed  drainage  Follow up with Dr. Wynona Neat or Nzinga Ferran NP in 1 year and As needed      Chronic allergic rhinitis Currently.  Continue Zyrtec daily.  Flonase as needed.  May use saline nasal spray and gel as needed  Plan Patient Instructions  Continue on CPAP at bedtime.  Goal is to wear CPAP all night long for at least 6 or more hours Do not drive if sleepy Healthy sleep regimen  Work on healthy weight Continue on Zyrtec daily as needed Continue on Flonase daily as needed Saline nasal gel At bedtime   Saline nasal spray Twice daily  As needed  drainage  Follow up with Dr. Wynona Neat or Yishai Rehfeld NP in 1 year and  As needed      Insomnia Healthy sleep regimen discussed.Rubye Oaks, NP 08/27/2023

## 2023-08-27 NOTE — Assessment & Plan Note (Signed)
Currently.  Continue Zyrtec daily.  Flonase as needed.  May use saline nasal spray and gel as needed  Plan Patient Instructions  Continue on CPAP at bedtime.  Goal is to wear CPAP all night long for at least 6 or more hours Do not drive if sleepy Healthy sleep regimen  Work on healthy weight Continue on Zyrtec daily as needed Continue on Flonase daily as needed Saline nasal gel At bedtime   Saline nasal spray Twice daily  As needed  drainage  Follow up with Dr. Wynona Neat or Tywan Siever NP in 1 year and As needed

## 2023-08-27 NOTE — Assessment & Plan Note (Signed)
Severe obstructive sleep apnea-excellent control and compliance on CPAP.  Patient has perceived benefit on CPAP.  Continue on current settings  Plan  Patient Instructions  Continue on CPAP at bedtime.  Goal is to wear CPAP all night long for at least 6 or more hours Do not drive if sleepy Healthy sleep regimen  Work on healthy weight Continue on Zyrtec daily as needed Continue on Flonase daily as needed Saline nasal gel At bedtime   Saline nasal spray Twice daily  As needed  drainage  Follow up with Dr. Wynona Neat or Carnie Bruemmer NP in 1 year and As needed

## 2023-08-28 NOTE — Progress Notes (Unsigned)
HPI: 63 year old female followed for obstructive sleep apnea, insomnia and chronic cough TEST/EVENTS :  Home sleep study August 03, 2022 showed severe sleep apnea with AHI at 49.8/hour and SpO2 low at 82%.  Pulmonary function testing done August 27, 2022 showed normal lung function with FEV1 at 92%, ratio 81, FVC 88%, no significant bronchodilator response, DLCO 87%. Chest x-ray August 27, 2022 was clear Pulmonary function testing done August 27, 2022 showed normal lung function with FEV1 at 92%, ratio 81, FVC 88%, no significant bronchodilator response, DLCO 87%.   08/31/23- 62 yoF with OSA, Insomnia, Intermittent chronic cough LOV Parrett, NP on 08/27/23 Told to f/u with Parrett or Olalere in 1 year.

## 2023-08-31 ENCOUNTER — Encounter: Payer: BC Managed Care – PPO | Admitting: Internal Medicine

## 2023-08-31 ENCOUNTER — Encounter: Payer: Self-pay | Admitting: Internal Medicine

## 2023-08-31 DIAGNOSIS — G4733 Obstructive sleep apnea (adult) (pediatric): Secondary | ICD-10-CM | POA: Diagnosis not present

## 2023-11-19 DIAGNOSIS — I10 Essential (primary) hypertension: Secondary | ICD-10-CM | POA: Diagnosis not present

## 2023-11-19 DIAGNOSIS — E7800 Pure hypercholesterolemia, unspecified: Secondary | ICD-10-CM | POA: Diagnosis not present

## 2023-11-19 DIAGNOSIS — J4530 Mild persistent asthma, uncomplicated: Secondary | ICD-10-CM | POA: Diagnosis not present

## 2023-11-19 DIAGNOSIS — M8588 Other specified disorders of bone density and structure, other site: Secondary | ICD-10-CM | POA: Diagnosis not present

## 2023-11-28 DIAGNOSIS — G4733 Obstructive sleep apnea (adult) (pediatric): Secondary | ICD-10-CM | POA: Diagnosis not present

## 2023-12-01 DIAGNOSIS — M5416 Radiculopathy, lumbar region: Secondary | ICD-10-CM | POA: Diagnosis not present

## 2023-12-03 DIAGNOSIS — M545 Low back pain, unspecified: Secondary | ICD-10-CM | POA: Diagnosis not present

## 2023-12-15 DIAGNOSIS — Z1231 Encounter for screening mammogram for malignant neoplasm of breast: Secondary | ICD-10-CM | POA: Diagnosis not present

## 2023-12-15 DIAGNOSIS — Z6833 Body mass index (BMI) 33.0-33.9, adult: Secondary | ICD-10-CM | POA: Diagnosis not present

## 2023-12-15 DIAGNOSIS — Z1151 Encounter for screening for human papillomavirus (HPV): Secondary | ICD-10-CM | POA: Diagnosis not present

## 2023-12-15 DIAGNOSIS — Z124 Encounter for screening for malignant neoplasm of cervix: Secondary | ICD-10-CM | POA: Diagnosis not present

## 2023-12-15 DIAGNOSIS — Z01419 Encounter for gynecological examination (general) (routine) without abnormal findings: Secondary | ICD-10-CM | POA: Diagnosis not present

## 2023-12-22 DIAGNOSIS — M545 Low back pain, unspecified: Secondary | ICD-10-CM | POA: Diagnosis not present

## 2024-01-19 DIAGNOSIS — M5416 Radiculopathy, lumbar region: Secondary | ICD-10-CM | POA: Diagnosis not present

## 2024-02-09 ENCOUNTER — Other Ambulatory Visit: Payer: Self-pay | Admitting: Cardiology

## 2024-02-15 NOTE — Procedures (Signed)
 Cancelled.

## 2024-02-15 NOTE — Progress Notes (Signed)
 Cancelled.

## 2024-02-18 ENCOUNTER — Other Ambulatory Visit: Payer: Self-pay | Admitting: Cardiology

## 2024-02-28 DIAGNOSIS — G4733 Obstructive sleep apnea (adult) (pediatric): Secondary | ICD-10-CM | POA: Diagnosis not present

## 2024-03-08 ENCOUNTER — Other Ambulatory Visit: Payer: Self-pay | Admitting: Cardiology

## 2024-03-09 NOTE — Telephone Encounter (Signed)
 Pt has not been seen since 03/05/2021 with Dr. Pietro. Pt has not made an appt. Would Dr. Pietro like to refill medication metoprolol  without pt being seen? Please address

## 2024-03-09 NOTE — Telephone Encounter (Signed)
 Called patient  to inform patient. Here last visit was in Nov 2022. She  states at her last visit - she can be seen as needed.  RN informed patient if [facility] is prescribing medication - it requires annual appointment. She can have her primary  to take over Rx.  Patient prefer to have Dr Pietro to fill.  Appointment schedule 05/25/24 at 11:40 am . Direction and address given  Prescription sent to pharmacy  90 day with no refills.  She verbalized understanding

## 2024-04-13 ENCOUNTER — Other Ambulatory Visit: Payer: Self-pay | Admitting: Cardiology

## 2024-04-21 DIAGNOSIS — R051 Acute cough: Secondary | ICD-10-CM | POA: Diagnosis not present

## 2024-04-21 DIAGNOSIS — J189 Pneumonia, unspecified organism: Secondary | ICD-10-CM | POA: Diagnosis not present

## 2024-05-22 DIAGNOSIS — K648 Other hemorrhoids: Secondary | ICD-10-CM | POA: Diagnosis not present

## 2024-05-22 DIAGNOSIS — E7800 Pure hypercholesterolemia, unspecified: Secondary | ICD-10-CM | POA: Diagnosis not present

## 2024-05-22 DIAGNOSIS — R053 Chronic cough: Secondary | ICD-10-CM | POA: Diagnosis not present

## 2024-05-22 DIAGNOSIS — K625 Hemorrhage of anus and rectum: Secondary | ICD-10-CM | POA: Diagnosis not present

## 2024-05-22 DIAGNOSIS — I10 Essential (primary) hypertension: Secondary | ICD-10-CM | POA: Diagnosis not present

## 2024-05-22 DIAGNOSIS — Z83430 Family history of elevated lipoprotein(a): Secondary | ICD-10-CM | POA: Diagnosis not present

## 2024-05-22 NOTE — Progress Notes (Unsigned)
 HPI: FU palpitations, chest pain and dyspnea. 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  However her LV function improved. Has had some dyspnea on exertion since being diagnosed with COVID. Monitor July 2022 showed sinus rhythm with occasional PAC, brief PAT, occasional PVC and 4 and 10 beat runs of nonsustained ventricular tachycardia.  Toprol  added. Coronary CTA 8/22 showed Ca score 1 and 0-24 Lcx.  Echocardiogram January 2024 at [facility] showed normal LV function, mild left atrial enlargement, mild to moderate mitral regurgitation and mild tricuspid regurgitation.  Cardiac MRI March 2024 at [facility] showed normal LV function and no significant abnormalities.  Since last seen, she has mild dyspnea on exertion but no orthopnea, PND, pedal edema, chest pain or syncope.  Occasional brief flutters but no sustained palpitations.  Current Outpatient Medications  Medication Sig Dispense Refill   acetaminophen (TYLENOL) 500 MG tablet Take 1,000 mg by mouth as needed (pain).     albuterol (VENTOLIN HFA) 108 (90 Base) MCG/ACT inhaler Inhale 1-2 puffs into the lungs every 6 (six) hours as needed.     aspirin 81 MG EC tablet Take 81 mg by mouth daily.     atorvastatin (LIPITOR) 10 MG tablet Take 10 mg by mouth at bedtime.     Budesonide  (PULMICORT  FLEXHALER) 90 MCG/ACT inhaler Inhale 2 puffs into the lungs 2 (two) times daily. 1 each 5   CALCIUM PO Take 1 tablet by mouth daily.     cetirizine (ZYRTEC) 10 MG chewable tablet Chew 10 mg by mouth at bedtime.     Docusate Sodium (DSS) 100 MG CAPS Take 300 mg by mouth at bedtime.     losartan (COZAAR) 25 MG tablet Take 25 mg by mouth daily.     magnesium gluconate (MAGONATE) 500 MG tablet Take 500 mg by mouth daily.     metoprolol  succinate (TOPROL -XL) 25 MG 24 hr tablet Take 1 tablet (25 mg total) by mouth daily. Please keep your appointment on 05/25/24  for future refills 90 tablet 0   NON FORMULARY Pulmicort   flexhaler     Magnesium 250 MG TABS Take 250 mg by mouth daily. (Patient not taking: Reported on 05/25/2024)     No current facility-administered medications for this visit.     Past Medical History:  Diagnosis Date   Allergy    Cancer (HCC)    basil cell   Cardiomegaly 1991   POSTPARTUM - after vaginal delivery   CHF (congestive heart failure) (HCC) 1992   POSTPARTUM- after vaginal delivery   Fibroids    Hyperlipidemia    Hypertension    POSTPARTUM   OSA (obstructive sleep apnea) 10/19/2022   Osteopenia    Sleep apnea     Past Surgical History:  Procedure Laterality Date   CESAREAN SECTION     2   COLONOSCOPY  05/2006   Jakie   HEMORRHOIDECTOMY WITH HEMORRHOID BANDING     POLYPECTOMY     right shoulder surgery Right    sacrocoloplasty     TUBAL LIGATION     WISDOM TOOTH EXTRACTION      Social History   Socioeconomic History   Marital status: Married    Spouse name: Not on file   Number of children: 4   Years of education: Not on file   Highest education level: Not on file  Occupational History   Occupation: physical therapist  Tobacco Use   Smoking status: Never   Smokeless tobacco: Never  Vaping  Use   Vaping status: Never Used  Substance and Sexual Activity   Alcohol use: Yes    Comment: occassionally   Drug use: No   Sexual activity: Not on file  Other Topics Concern   Not on file  Social History Narrative   Not on file   Social Drivers of Health   Financial Resource Strain: Low Risk  (10/14/2022)   Received from [facility]   Overall Financial Resource Strain (CARDIA)    Difficulty of Paying Living Expenses: Not hard at all  Food Insecurity: No Food Insecurity (10/14/2022)   Received from [facility]   Hunger Vital Sign    Within the past 12 months, you worried that your food would run out before you got the money to buy more.: Never true    Within the past 12 months, the food you bought just didn't last and you didn't have money to  get more.: Never true  Transportation Needs: No Transportation Needs (10/14/2022)   Received from [facility] - Transportation    Lack of Transportation (Medical): No    Lack of Transportation (Non-Medical): No  Physical Activity: Not on file  Stress: Not on file  Social Connections: Unknown (07/07/2022)   [facility]   Social Network    Social Network: Not on file  Intimate Partner Violence: Unknown (07/07/2022)   Received from [facility]   HITS    Physically Hurt: Not on file    Insult or Talk Down To: Not on file    Threaten Physical Harm: Not on file    Scream or Curse: Not on file    Family History  Problem Relation Age of Onset   Hypertension Mother    CVA Mother    AAA (abdominal aortic aneurysm) Mother    Hypertension Father    CVA Father    Colon cancer Neg Hx    Esophageal cancer Neg Hx    Stomach cancer Neg Hx    Colon polyps Neg Hx    Rectal cancer Neg Hx     ROS: no fevers or chills, productive cough, hemoptysis, dysphasia, odynophagia, melena, hematochezia, dysuria, hematuria, rash, seizure activity, orthopnea, PND, pedal edema, claudication. Remaining systems are negative.  Physical Exam: Well-developed well-nourished in no acute distress.  Skin is warm and dry.  HEENT is normal.  Neck is supple.  Chest is clear to auscultation with normal expansion.  Cardiovascular exam is regular rate and rhythm.  Abdominal exam nontender or distended. No masses palpated. Extremities show no edema. neuro grossly intact  EKG Interpretation Date/Time:  Thursday May 25 2024 11:31:06 EST Ventricular Rate:  58 PR Interval:  172 QRS Duration:  84 QT Interval:  402 QTC Calculation: 394 R Axis:   -14  Text Interpretation: Sinus bradycardia with sinus arrhythmia Non-specific ST-t changes Confirmed by Pietro Rogue ([id number]) on 05/25/2024 11:32:00 AM    A/P  1 history of dyspnea-previous evaluation showed minimal coronary artery  disease, normal LV function and previously found to have no pulmonary embolus.  Symptoms improved.  2 minimal coronary artery disease-continue statin.  3 hyperlipidemia-continue statin.  4 palpitations-symptoms are reasonly well-controlled.  Continue beta-blocker.  5 hypertension-patient's blood pressure is low.  Discontinue losartan and follow.  Rogue Pietro, MD

## 2024-05-25 ENCOUNTER — Ambulatory Visit: Attending: Cardiology | Admitting: Cardiology

## 2024-05-25 ENCOUNTER — Encounter: Payer: Self-pay | Admitting: Cardiology

## 2024-05-25 VITALS — BP 90/70 | HR 58 | Ht 64.0 in | Wt 210.0 lb

## 2024-05-25 DIAGNOSIS — I2510 Atherosclerotic heart disease of native coronary artery without angina pectoris: Secondary | ICD-10-CM

## 2024-05-25 DIAGNOSIS — R0609 Other forms of dyspnea: Secondary | ICD-10-CM

## 2024-05-25 DIAGNOSIS — G4733 Obstructive sleep apnea (adult) (pediatric): Secondary | ICD-10-CM | POA: Diagnosis not present

## 2024-05-25 DIAGNOSIS — R002 Palpitations: Secondary | ICD-10-CM

## 2024-05-25 DIAGNOSIS — I10 Essential (primary) hypertension: Secondary | ICD-10-CM | POA: Diagnosis not present

## 2024-05-25 DIAGNOSIS — E7800 Pure hypercholesterolemia, unspecified: Secondary | ICD-10-CM | POA: Diagnosis not present

## 2024-05-25 MED ORDER — METOPROLOL SUCCINATE ER 25 MG PO TB24: 25.0000 mg | ORAL_TABLET | Freq: Every day | ORAL | 3 refills | Status: AC

## 2024-05-25 NOTE — Patient Instructions (Signed)
 Medication Instructions:   STOP LOSARTAN  *If you need a refill on your cardiac medications before your next appointment, please call your pharmacy*  Follow-Up: At [facility], you and your health needs are our priority.  As part of our continuing mission to provide you with exceptional heart care, our providers are all part of one team.  This team includes your primary Cardiologist (physician) and Advanced Practice Providers or APPs (Physician Assistants and Nurse Practitioners) who all work together to provide you with the care you need, when you need it.  Your next appointment:   12 month(s)  Provider:   REDELL SHALLOW MD

## 2024-08-25 ENCOUNTER — Ambulatory Visit: Payer: BC Managed Care – PPO | Admitting: Adult Health
